# Patient Record
Sex: Female | Born: 1945
Health system: Southern US, Community
[De-identification: ages and names within clinical notes are randomized; demographics above are authoritative.]

## PROBLEM LIST (undated history)

## (undated) DIAGNOSIS — R0683 Snoring: Secondary | ICD-10-CM

## (undated) DIAGNOSIS — E7800 Pure hypercholesterolemia, unspecified: Secondary | ICD-10-CM

## (undated) DIAGNOSIS — K7460 Unspecified cirrhosis of liver: Secondary | ICD-10-CM

## (undated) DIAGNOSIS — E079 Disorder of thyroid, unspecified: Secondary | ICD-10-CM

## (undated) DIAGNOSIS — E119 Type 2 diabetes mellitus without complications: Secondary | ICD-10-CM

## (undated) DIAGNOSIS — Z972 Presence of dental prosthetic device (complete) (partial): Secondary | ICD-10-CM

## (undated) DIAGNOSIS — C801 Malignant (primary) neoplasm, unspecified: Secondary | ICD-10-CM

## (undated) DIAGNOSIS — Z9289 Personal history of other medical treatment: Secondary | ICD-10-CM

## (undated) HISTORY — PX: ABDOMINAL HYSTERECTOMY: SHX81

## (undated) HISTORY — PX: THYROIDECTOMY: SHX17

## (undated) HISTORY — PX: ECTOPIC PREGNANCY SURGERY: SHX613

## (undated) HISTORY — PX: ABDOMINAL EXPLORATION SURGERY: SHX538

---

## 2013-03-23 ENCOUNTER — Ambulatory Visit: Payer: Self-pay | Admitting: Family Medicine

## 2013-03-28 ENCOUNTER — Emergency Department: Payer: Self-pay | Admitting: Internal Medicine

## 2013-03-28 LAB — COMPREHENSIVE METABOLIC PANEL
Alkaline Phosphatase: 101 U/L (ref 50–136)
Anion Gap: 4 — ABNORMAL LOW (ref 7–16)
BUN: 14 mg/dL (ref 7–18)
Bilirubin,Total: 0.3 mg/dL (ref 0.2–1.0)
Chloride: 99 mmol/L (ref 98–107)
Co2: 29 mmol/L (ref 21–32)
Creatinine: 1.03 mg/dL (ref 0.60–1.30)
EGFR (Non-African Amer.): 57 — ABNORMAL LOW
SGPT (ALT): 32 U/L (ref 12–78)

## 2013-03-28 LAB — CBC WITH DIFFERENTIAL/PLATELET
Basophil #: 0.1 10*3/uL (ref 0.0–0.1)
Basophil %: 0.8 %
HCT: 45.7 % (ref 35.0–47.0)
HGB: 15.9 g/dL (ref 12.0–16.0)
Lymphocyte #: 2.3 10*3/uL (ref 1.0–3.6)
Lymphocyte %: 30.7 %
MCV: 95 fL (ref 80–100)
Monocyte %: 6.8 %
Neutrophil %: 58.2 %
Platelet: 311 10*3/uL (ref 150–440)
RBC: 4.83 10*6/uL (ref 3.80–5.20)
RDW: 12.7 % (ref 11.5–14.5)

## 2013-03-29 ENCOUNTER — Emergency Department: Payer: Self-pay | Admitting: Emergency Medicine

## 2013-04-02 LAB — CULTURE, BLOOD (SINGLE)

## 2013-07-09 ENCOUNTER — Ambulatory Visit: Payer: Self-pay | Admitting: Family Medicine

## 2014-11-17 ENCOUNTER — Other Ambulatory Visit: Payer: Self-pay | Admitting: Family Medicine

## 2014-11-17 DIAGNOSIS — Z1231 Encounter for screening mammogram for malignant neoplasm of breast: Secondary | ICD-10-CM

## 2014-11-24 ENCOUNTER — Ambulatory Visit: Payer: Medicare Other | Attending: Family Medicine

## 2015-05-11 ENCOUNTER — Ambulatory Visit
Admission: RE | Admit: 2015-05-11 | Discharge: 2015-05-11 | Disposition: A | Discharge status: Home / Self Care | Payer: Medicare Other | Source: Ambulatory Visit | Attending: Family Medicine | Admitting: Family Medicine

## 2015-05-11 DIAGNOSIS — Z1231 Encounter for screening mammogram for malignant neoplasm of breast: Secondary | ICD-10-CM | POA: Diagnosis not present

## 2016-06-15 ENCOUNTER — Emergency Department
Admission: EM | Admit: 2016-06-15 | Discharge: 2016-06-15 | Disposition: A | Discharge status: Home / Self Care | Payer: Medicare Other | Attending: Emergency Medicine | Admitting: Emergency Medicine

## 2016-06-15 ENCOUNTER — Emergency Department: Payer: Medicare Other

## 2016-06-15 ENCOUNTER — Encounter: Payer: Self-pay | Admitting: Emergency Medicine

## 2016-06-15 DIAGNOSIS — E119 Type 2 diabetes mellitus without complications: Secondary | ICD-10-CM | POA: Diagnosis not present

## 2016-06-15 DIAGNOSIS — R1030 Lower abdominal pain, unspecified: Secondary | ICD-10-CM | POA: Diagnosis present

## 2016-06-15 DIAGNOSIS — K529 Noninfective gastroenteritis and colitis, unspecified: Secondary | ICD-10-CM | POA: Diagnosis not present

## 2016-06-15 DIAGNOSIS — Z87891 Personal history of nicotine dependence: Secondary | ICD-10-CM | POA: Diagnosis not present

## 2016-06-15 DIAGNOSIS — Z79899 Other long term (current) drug therapy: Secondary | ICD-10-CM | POA: Diagnosis not present

## 2016-06-15 HISTORY — DX: Type 2 diabetes mellitus without complications: E11.9

## 2016-06-15 HISTORY — DX: Disorder of thyroid, unspecified: E07.9

## 2016-06-15 LAB — COMPREHENSIVE METABOLIC PANEL
ALK PHOS: 56 U/L (ref 38–126)
ALT: 68 U/L — AB (ref 14–54)
ANION GAP: 9 (ref 5–15)
AST: 51 U/L — ABNORMAL HIGH (ref 15–41)
Albumin: 4.1 g/dL (ref 3.5–5.0)
BUN: 19 mg/dL (ref 6–20)
CALCIUM: 9.5 mg/dL (ref 8.9–10.3)
CHLORIDE: 102 mmol/L (ref 101–111)
CO2: 26 mmol/L (ref 22–32)
CREATININE: 0.69 mg/dL (ref 0.44–1.00)
Glucose, Bld: 328 mg/dL — ABNORMAL HIGH (ref 65–99)
Potassium: 3.9 mmol/L (ref 3.5–5.1)
SODIUM: 137 mmol/L (ref 135–145)
Total Bilirubin: 0.7 mg/dL (ref 0.3–1.2)
Total Protein: 7.7 g/dL (ref 6.5–8.1)

## 2016-06-15 LAB — URINALYSIS, COMPLETE (UACMP) WITH MICROSCOPIC
BILIRUBIN URINE: NEGATIVE
Glucose, UA: >=500 mg/dL — AB
HGB URINE DIPSTICK: NEGATIVE
KETONES UR: 5 mg/dL — AB
Nitrite: NEGATIVE
PH: 5 (ref 5.0–8.0)
PROTEIN: NEGATIVE mg/dL
Specific Gravity, Urine: 1.025 (ref 1.005–1.030)

## 2016-06-15 LAB — CBC
HCT: 38.8 % (ref 35.0–47.0)
HEMOGLOBIN: 13.6 g/dL (ref 12.0–16.0)
MCH: 33.1 pg (ref 26.0–34.0)
MCHC: 35 g/dL (ref 32.0–36.0)
MCV: 94.3 fL (ref 80.0–100.0)
PLATELETS: 253 10*3/uL (ref 150–440)
RBC: 4.12 MIL/uL (ref 3.80–5.20)
RDW: 13 % (ref 11.5–14.5)
WBC: 5.3 10*3/uL (ref 3.6–11.0)

## 2016-06-15 LAB — LIPASE, BLOOD: LIPASE: 60 U/L — AB (ref 11–51)

## 2016-06-15 MED ORDER — DICYCLOMINE HCL 20 MG PO TABS: 20.0000 mg | ORAL_TABLET | Freq: Three times a day (TID) | ORAL | 0 refills | Status: DC | PRN

## 2016-06-15 MED ORDER — IOPAMIDOL (ISOVUE-300) INJECTION 61%
100.0000 mL | Freq: Once | INTRAVENOUS | Status: AC | PRN
  Administered 2016-06-15: 100 mL via INTRAVENOUS
  Filled 2016-06-15: qty 100

## 2016-06-15 MED ORDER — ONDANSETRON HCL 4 MG PO TABS: 4.0000 mg | ORAL_TABLET | Freq: Three times a day (TID) | ORAL | 0 refills | Status: DC | PRN

## 2016-06-15 MED ORDER — IOPAMIDOL (ISOVUE-300) INJECTION 61%
30.0000 mL | Freq: Once | INTRAVENOUS | Status: AC | PRN
  Administered 2016-06-15: 30 mL via ORAL
  Filled 2016-06-15: qty 30

## 2016-06-15 MED ORDER — KETOROLAC TROMETHAMINE 30 MG/ML IJ SOLN
15.0000 mg | Freq: Once | INTRAMUSCULAR | Status: AC
  Administered 2016-06-15: 15 mg via INTRAVENOUS
  Filled 2016-06-15: qty 1

## 2016-06-15 MED ORDER — SODIUM CHLORIDE 0.9 % IV BOLUS (SEPSIS)
500.0000 mL | Freq: Once | INTRAVENOUS | Status: AC
  Administered 2016-06-15: 500 mL via INTRAVENOUS

## 2016-06-15 MED ORDER — ONDANSETRON HCL 4 MG/2ML IJ SOLN
4.0000 mg | Freq: Once | INTRAMUSCULAR | Status: AC
  Administered 2016-06-15: 4 mg via INTRAVENOUS
  Filled 2016-06-15: qty 2

## 2016-06-15 NOTE — ED Provider Notes (Signed)
Time Seen: Approximately 1404 I have reviewed the triage notes  Chief Complaint: Abdominal Pain and Fever   History of Present Illness: Tiffany Mann is a 71 y.o. female who presents with some lower abdominal pain with feelings of a fever that started on Wednesday (2 days prior to arrival). Patient reports some nausea no persistent vomiting. She describes some loose stool without melena or hematochezia. She is not aware of any obvious foodborne exposures. She states she has noticed that her blood sugars have run elevated for the past week. She points primarily to the lower middle quadrant and left side slightly worse than the right. Surgical history includes abdominal hysterectomy with oophorectomy by description along with an appendectomy. She describes the pain is radiating from the lower abdominal region up toward the umbilicus and occasionally toward the epigastric region. She denies any back or flank pain   Past Medical History:  Diagnosis Date  . Diabetes mellitus without complication ([place])   . Thyroid disease     There are no active problems to display for this patient.   Past Surgical History:  Procedure Laterality Date  . ABDOMINAL HYSTERECTOMY    . THYROIDECTOMY      Past Surgical History:  Procedure Laterality Date  . ABDOMINAL HYSTERECTOMY    . THYROIDECTOMY        Allergies:  Patient has no known allergies.  Family History: No family history on file.  Social History: Social History  Substance Use Topics  . Smoking status: Former Smoker    Types: Cigarettes    Quit date: 54  . Smokeless tobacco: Never Used  . Alcohol use No     Review of Systems:   10 point review of systems was performed and was otherwise negative:  Constitutional: No fever Eyes: No visual disturbances ENT: No sore throat, ear pain Cardiac: No chest pain Respiratory: No shortness of breath, wheezing, or stridor Abdomen: Abdominal pains lower middle without any  lateralization. She describes it as constant and occasionally with sharp exacerbations. Endocrine: No weight loss, No night sweats Extremities: No peripheral edema, cyanosis Skin: No rashes, easy bruising Neurologic: No focal weakness, trouble with speech or swollowing Urologic: No dysuria, Hematuria, or urinary frequency   Physical Exam:  ED Triage Vitals [06/15/16 1047]  Enc Vitals Group     BP (!) 173/93     Pulse Rate (!) 104     Resp 20     Temp 98.7 F (37.1 C)     Temp Source Oral     SpO2 97 %     Weight 160 lb (72.6 kg)     Height '5\' 4"'$  (1.626 m)     Head Circumference      Peak Flow      Pain Score 7     Pain Loc      Pain Edu?      Excl. in [place]?     General: Awake , Alert , and Oriented times 3; GCS 15 Head: Normal cephalic , atraumatic Eyes: Pupils equal , round, reactive to light Nose/Throat: No nasal drainage, patent upper airway without erythema or exudate.  Neck: Supple, Full range of motion, No anterior adenopathy or palpable thyroid masses Lungs: Clear to ascultation without wheezes , rhonchi, or rales Heart: Regular rate, regular rhythm without murmurs , gallops , or rubs Abdomen: Mild tenderness to deep palpation without any rebound, guarding, or rigidity. No palpable masses and bowel sounds are positive and symmetric in all 4 quadrants.  Extremities: 2 plus symmetric pulses. No edema, clubbing or cyanosis Neurologic: normal ambulation, Motor symmetric without deficits, sensory intact Skin: warm, dry, no rashes   Labs:   All laboratory work was reviewed including any pertinent negatives or positives listed below:  Labs Reviewed  LIPASE, BLOOD - Abnormal; Notable for the following:       Result Value   Lipase 60 (*)    All other components within normal limits  COMPREHENSIVE METABOLIC PANEL - Abnormal; Notable for the following:    Glucose, Bld 328 (*)    AST 51 (*)    ALT 68 (*)    All other components within normal limits  URINALYSIS,  COMPLETE (UACMP) WITH MICROSCOPIC - Abnormal; Notable for the following:    Color, Urine YELLOW (*)    APPearance CLEAR (*)    Glucose, UA >=500 (*)    Ketones, ur 5 (*)    Leukocytes, UA SMALL (*)    Bacteria, UA RARE (*)    Squamous Epithelial / LPF 6-30 (*)    All other components within normal limits  CBC    Radiology: *   "Ct Abdomen Pelvis W Contrast  Result Date: 06/15/2016 CLINICAL DATA:  Lower abdominal pain and fever EXAM: CT ABDOMEN AND PELVIS WITH CONTRAST TECHNIQUE: Multidetector CT imaging of the abdomen and pelvis was performed using the standard protocol following bolus administration of intravenous contrast. CONTRAST:  153m ISOVUE-300 IOPAMIDOL (ISOVUE-300) INJECTION 61% COMPARISON:  None. FINDINGS: Lower chest: Mild atelectatic changes are noted bilaterally. Hepatobiliary: No focal liver abnormality is seen. No gallstones, gallbladder wall thickening, or biliary dilatation. Pancreas: Unremarkable. No pancreatic ductal dilatation or surrounding inflammatory changes. Spleen: Normal in size without focal abnormality. Adrenals/Urinary Tract: Adrenal glands are unremarkable. Kidneys are normal, without renal calculi, focal lesion, or hydronephrosis. Bladder is unremarkable. Stomach/Bowel: Stomach is within normal limits. Appendix appears normal. No evidence of bowel wall thickening, distention, or inflammatory changes. Vascular/Lymphatic: Aortic atherosclerosis. No enlarged abdominal or pelvic lymph nodes. Reproductive: Status post hysterectomy. No adnexal masses. Other: Fat containing umbilical hernia is noted. Musculoskeletal: Degenerative changes of lumbar spine are noted. IMPRESSION: Chronic changes as described above.  No acute abnormality is noted. Electronically Signed   By: MInez CatalinaM.D.   On: 06/15/2016 16:13  "    I personally reviewed the radiologic studies     ED Course:  Patient's stay was uneventful and her differential was primarily around gastroenteritis,  colitis, diverticulitis. Her lipase and her LFTs are only slightly elevated I felt of not significant clinical concern at this time. Patient most likely has gastroenteritis based on her history and review of systems and CAT scan findings etc. Patient will be prescribed medication directed at such and advised to seek care if she develops any blood in her stool, persistent vomiting, high fever, or change in the location and characteristics of her abdominal pain.     Assessment:  Acute unspecified abdominal pain     Plan:  Outpatient " Discharge Medication List as of 06/15/2016  4:27 PM    START taking these medications   Details  dicyclomine (BENTYL) 20 MG tablet Take 1 tablet (20 mg total) by mouth 3 (three) times daily as needed for spasms., Starting Fri 06/15/2016, Print    ondansetron (ZOFRAN) 4 MG tablet Take 1 tablet (4 mg total) by mouth every 8 (eight) hours as needed for nausea or vomiting., Starting Fri 06/15/2016, Print      " Patient was advised to return immediately if condition  worsens. Patient was advised to follow up with their primary care physician or other specialized physicians involved in their outpatient care. The patient and/or family member/power of attorney had laboratory results reviewed at the bedside. All questions and concerns were addressed and appropriate discharge instructions were distributed by the nursing staff. Patient was especially advised to keep close observation of her blood sugars and adjust her home medication accordingly.           Daymon Larsen, MD 06/15/16 [phone]

## 2016-06-15 NOTE — Discharge Instructions (Signed)
Return to emergency department or be seen as soon as possible for high fever, bloody diarrhea, increasing abdominal pain especially with vomiting or any other new concerns. Please adjust your diabetic medications appropriately for mildly elevated blood sugar  Please return immediately if condition worsens. Please contact her primary physician or the physician you were given for referral. If you have any specialist physicians involved in her treatment and plan please also contact them. Thank you for using [facility] emergency Department.

## 2016-06-15 NOTE — ED Triage Notes (Signed)
Pt in via POV with complaints of lower abdominal pain and fever since Wednesday, pt reports N/V/D.  Pt in no immediate distress at this time.

## 2017-01-04 ENCOUNTER — Other Ambulatory Visit: Payer: Self-pay | Admitting: Family Medicine

## 2017-01-04 DIAGNOSIS — Z1239 Encounter for other screening for malignant neoplasm of breast: Secondary | ICD-10-CM

## 2017-01-04 DIAGNOSIS — Z780 Asymptomatic menopausal state: Secondary | ICD-10-CM

## 2017-02-12 ENCOUNTER — Other Ambulatory Visit: Payer: Medicare Other

## 2017-03-25 ENCOUNTER — Ambulatory Visit
Admission: RE | Admit: 2017-03-25 | Discharge: 2017-03-25 | Disposition: A | Discharge status: Home / Self Care | Payer: Medicare Other | Source: Ambulatory Visit | Attending: Family Medicine | Admitting: Family Medicine

## 2017-03-25 DIAGNOSIS — Z780 Asymptomatic menopausal state: Secondary | ICD-10-CM | POA: Diagnosis present

## 2017-03-25 DIAGNOSIS — Z1231 Encounter for screening mammogram for malignant neoplasm of breast: Secondary | ICD-10-CM | POA: Insufficient documentation

## 2017-03-25 DIAGNOSIS — E119 Type 2 diabetes mellitus without complications: Secondary | ICD-10-CM | POA: Diagnosis not present

## 2017-03-25 DIAGNOSIS — R928 Other abnormal and inconclusive findings on diagnostic imaging of breast: Secondary | ICD-10-CM | POA: Diagnosis not present

## 2017-03-25 DIAGNOSIS — Z1239 Encounter for other screening for malignant neoplasm of breast: Secondary | ICD-10-CM | POA: Insufficient documentation

## 2017-03-29 ENCOUNTER — Other Ambulatory Visit: Payer: Self-pay | Admitting: Family Medicine

## 2017-03-29 DIAGNOSIS — N6489 Other specified disorders of breast: Secondary | ICD-10-CM

## 2017-03-29 DIAGNOSIS — R928 Other abnormal and inconclusive findings on diagnostic imaging of breast: Secondary | ICD-10-CM

## 2017-04-04 ENCOUNTER — Ambulatory Visit
Admission: RE | Admit: 2017-04-04 | Discharge: 2017-04-04 | Disposition: A | Discharge status: Home / Self Care | Payer: Medicare Other | Source: Ambulatory Visit | Attending: Family Medicine | Admitting: Family Medicine

## 2017-04-04 DIAGNOSIS — N6489 Other specified disorders of breast: Secondary | ICD-10-CM | POA: Diagnosis not present

## 2017-04-04 DIAGNOSIS — R928 Other abnormal and inconclusive findings on diagnostic imaging of breast: Secondary | ICD-10-CM

## 2017-08-21 NOTE — Discharge Instructions (Signed)

## 2017-08-27 ENCOUNTER — Encounter
Admission: RE | Disposition: A | Discharge status: Home / Self Care | Payer: Self-pay | Source: Ambulatory Visit | Attending: Ophthalmology

## 2017-08-27 ENCOUNTER — Ambulatory Visit: Payer: Medicare Other | Admitting: Anesthesiology

## 2017-08-27 ENCOUNTER — Ambulatory Visit
Admission: RE | Admit: 2017-08-27 | Discharge: 2017-08-27 | Disposition: A | Discharge status: Home / Self Care | Payer: Medicare Other | Source: Ambulatory Visit | Attending: Ophthalmology | Admitting: Ophthalmology

## 2017-08-27 DIAGNOSIS — H2512 Age-related nuclear cataract, left eye: Secondary | ICD-10-CM | POA: Diagnosis not present

## 2017-08-27 DIAGNOSIS — I10 Essential (primary) hypertension: Secondary | ICD-10-CM | POA: Diagnosis not present

## 2017-08-27 DIAGNOSIS — E119 Type 2 diabetes mellitus without complications: Secondary | ICD-10-CM | POA: Diagnosis not present

## 2017-08-27 DIAGNOSIS — Z7984 Long term (current) use of oral hypoglycemic drugs: Secondary | ICD-10-CM | POA: Diagnosis not present

## 2017-08-27 DIAGNOSIS — Z7982 Long term (current) use of aspirin: Secondary | ICD-10-CM | POA: Insufficient documentation

## 2017-08-27 DIAGNOSIS — Z87891 Personal history of nicotine dependence: Secondary | ICD-10-CM | POA: Diagnosis not present

## 2017-08-27 DIAGNOSIS — Z79899 Other long term (current) drug therapy: Secondary | ICD-10-CM | POA: Insufficient documentation

## 2017-08-27 HISTORY — PX: CATARACT EXTRACTION W/PHACO: SHX586

## 2017-08-27 HISTORY — DX: Snoring: R06.83

## 2017-08-27 HISTORY — DX: Presence of dental prosthetic device (complete) (partial): Z97.2

## 2017-08-27 HISTORY — DX: Pure hypercholesterolemia, unspecified: E78.00

## 2017-08-27 HISTORY — DX: Personal history of other medical treatment: Z92.89

## 2017-08-27 LAB — GLUCOSE, CAPILLARY
GLUCOSE-CAPILLARY: 199 mg/dL — AB (ref 65–99)
GLUCOSE-CAPILLARY: 191 mg/dL — AB (ref 65–99)

## 2017-08-27 SURGERY — PHACOEMULSIFICATION, CATARACT, WITH IOL INSERTION
Anesthesia: Monitor Anesthesia Care | Laterality: Left | Wound class: "Clean "

## 2017-08-27 MED ORDER — FENTANYL CITRATE (PF) 100 MCG/2ML IJ SOLN: 25.0000 ug | INTRAMUSCULAR | Status: DC | PRN

## 2017-08-27 MED ORDER — PROMETHAZINE HCL 25 MG/ML IJ SOLN: 6.2500 mg | INTRAMUSCULAR | Status: DC | PRN

## 2017-08-27 MED ORDER — MOXIFLOXACIN HCL 0.5 % OP SOLN
OPHTHALMIC | Status: DC | PRN
  Administered 2017-08-27: 1 [drp] via OPHTHALMIC

## 2017-08-27 MED ORDER — OXYCODONE HCL 5 MG PO TABS: 5.0000 mg | ORAL_TABLET | Freq: Once | ORAL | Status: DC | PRN

## 2017-08-27 MED ORDER — LIDOCAINE HCL (PF) 2 % IJ SOLN
INTRAOCULAR | Status: DC | PRN
  Administered 2017-08-27: 1 mL via INTRAOCULAR

## 2017-08-27 MED ORDER — OXYCODONE HCL 5 MG/5ML PO SOLN: 5.0000 mg | Freq: Once | ORAL | Status: DC | PRN

## 2017-08-27 MED ORDER — SODIUM HYALURONATE 23 MG/ML IO SOLN
INTRAOCULAR | Status: DC | PRN
  Administered 2017-08-27: 0.6 mL via INTRAOCULAR

## 2017-08-27 MED ORDER — SODIUM HYALURONATE 10 MG/ML IO SOLN
INTRAOCULAR | Status: DC | PRN
  Administered 2017-08-27: 0.55 mL via INTRAOCULAR

## 2017-08-27 MED ORDER — MEPERIDINE HCL 25 MG/ML IJ SOLN: 6.2500 mg | INTRAMUSCULAR | Status: DC | PRN

## 2017-08-27 MED ORDER — EPINEPHRINE PF 1 MG/ML IJ SOLN
INTRAOCULAR | Status: DC | PRN
  Administered 2017-08-27: 80 mL via OPHTHALMIC

## 2017-08-27 MED ORDER — ARMC OPHTHALMIC DILATING DROPS
1.0000 "application " | OPHTHALMIC | Status: DC | PRN
  Administered 2017-08-27 (×3): 1 via OPHTHALMIC

## 2017-08-27 MED ORDER — LACTATED RINGERS IV SOLN: 10.0000 mL/h | INTRAVENOUS | Status: DC

## 2017-08-27 MED ORDER — MIDAZOLAM HCL 2 MG/2ML IJ SOLN
INTRAMUSCULAR | Status: DC | PRN
  Administered 2017-08-27: 2 mg via INTRAVENOUS

## 2017-08-27 MED ORDER — FENTANYL CITRATE (PF) 100 MCG/2ML IJ SOLN
INTRAMUSCULAR | Status: DC | PRN
  Administered 2017-08-27: 50 ug via INTRAVENOUS

## 2017-08-27 MED ORDER — LACTATED RINGERS IV SOLN: INTRAVENOUS | Status: DC

## 2017-08-27 SURGICAL SUPPLY — 17 items
CANNULA ANT/CHMB 27G (MISCELLANEOUS) ×1 IMPLANT
CANNULA ANT/CHMB 27GA (MISCELLANEOUS) ×3 IMPLANT
DISSECTOR HYDRO NUCLEUS 50X22 (MISCELLANEOUS) ×3 IMPLANT
GLOVE BIO SURGEON STRL SZ8 (GLOVE) ×3 IMPLANT
GLOVE SURG LX 7.5 STRW (GLOVE) ×2
GLOVE SURG LX STRL 7.5 STRW (GLOVE) ×1 IMPLANT
GOWN STRL REUS W/ TWL LRG LVL3 (GOWN DISPOSABLE) ×2 IMPLANT
GOWN STRL REUS W/TWL LRG LVL3 (GOWN DISPOSABLE) ×4
LENS IOL TECNIS ITEC 21.0 (Intraocular Lens) ×2 IMPLANT
MARKER SKIN DUAL TIP RULER LAB (MISCELLANEOUS) ×3 IMPLANT
PACK CATARACT (MISCELLANEOUS) ×3 IMPLANT
PACK DR. KING ARMS (PACKS) ×3 IMPLANT
PACK EYE AFTER SURG (MISCELLANEOUS) ×3 IMPLANT
SYR 3ML LL SCALE MARK (SYRINGE) ×3 IMPLANT
SYR TB 1ML LUER SLIP (SYRINGE) ×3 IMPLANT
WATER STERILE IRR 500ML POUR (IV SOLUTION) ×3 IMPLANT
WIPE NON LINTING 3.25X3.25 (MISCELLANEOUS) ×3 IMPLANT

## 2017-08-27 NOTE — Op Note (Signed)
OPERATIVE NOTE  SHAKURA COWING [id number] 08/27/2017   PREOPERATIVE DIAGNOSIS:  Nuclear sclerotic cataract left eye.  H25.12   POSTOPERATIVE DIAGNOSIS:    Nuclear sclerotic cataract left eye.     PROCEDURE:  Phacoemusification with posterior chamber intraocular lens placement of the left eye   LENS:   Implant Name Type Inv. Item Serial No. Manufacturer Lot No. LRB No. Used  LENS IOL DIOP 21.0 - [id number] Intraocular Lens LENS IOL DIOP 21.0 [id number] AMO  Left 1       PCB00 +21.0   ULTRASOUND TIME: 0 minutes 31 seconds.  CDE 6.37   SURGEON:  Benay Pillow, MD, MPH   ANESTHESIA:  Topical with tetracaine drops augmented with 1% preservative-free intracameral lidocaine.  ESTIMATED BLOOD LOSS: <1 mL   COMPLICATIONS:  None.   DESCRIPTION OF PROCEDURE:  The patient was identified in the holding room and transported to the operating room and placed in the supine position under the operating microscope.  The left eye was identified as the operative eye and it was prepped and draped in the usual sterile ophthalmic fashion.   A 1.0 millimeter clear-corneal paracentesis was made at the 5:00 position. 0.5 ml of preservative-free 1% lidocaine with epinephrine was injected into the anterior chamber.  The anterior chamber was filled with Healon 5 viscoelastic.  A 2.4 millimeter keratome was used to make a near-clear corneal incision at the 2:00 position.  A curvilinear capsulorrhexis was made with a cystotome and capsulorrhexis forceps.  Balanced salt solution was used to hydrodissect and hydrodelineate the nucleus.   Phacoemulsification was then used in stop and chop fashion to remove the lens nucleus and epinucleus.  The remaining cortex was then removed using the irrigation and aspiration handpiece. Healon was then placed into the capsular bag to distend it for lens placement.  A lens was then injected into the capsular bag.  The remaining viscoelastic was aspirated.   Wounds were  hydrated with balanced salt solution.  The anterior chamber was inflated to a physiologic pressure with balanced salt solution.  Intracameral vigamox 0.1 mL undiltued was injected into the eye and a drop placed onto the ocular surface.  No wound leaks were noted.  The patient was taken to the recovery room in stable condition without complications of anesthesia or surgery  Benay Pillow 08/27/2017, 7:50 AM

## 2017-08-27 NOTE — Transfer of Care (Signed)
Immediate Anesthesia Transfer of Care Note  Patient: Tiffany Mann  Procedure(s) Performed: CATARACT EXTRACTION PHACO AND INTRAOCULAR LENS PLACEMENT (IOC) LEFT DIABETIC (Left )  Patient Location: PACU  Anesthesia Type: MAC  Level of Consciousness: awake, alert  and patient cooperative  Airway and Oxygen Therapy: Patient Spontanous Breathing and Patient connected to supplemental oxygen  Post-op Assessment: Post-op Vital signs reviewed, Patient's Cardiovascular Status Stable, Respiratory Function Stable, Patent Airway and No signs of Nausea or vomiting  Post-op Vital Signs: Reviewed and stable  Complications: No apparent anesthesia complications

## 2017-08-27 NOTE — Anesthesia Preprocedure Evaluation (Signed)
Anesthesia Evaluation  Patient identified by MRN, date of birth, ID band Patient awake    Reviewed: Allergy & Precautions, H&P , NPO status , Patient's Chart, lab work & pertinent test results, reviewed documented beta blocker date and time   History of Anesthesia Complications Negative for: history of anesthetic complications  Airway Mallampati: II  TM Distance: >3 FB Neck ROM: full    Dental no notable dental hx.    Pulmonary former smoker,    Pulmonary exam normal breath sounds clear to auscultation       Cardiovascular Exercise Tolerance: Good negative cardio ROS   Rhythm:regular Rate:Normal     Neuro/Psych negative neurological ROS  negative psych ROS   GI/Hepatic negative GI ROS, Neg liver ROS,   Endo/Other  diabetes  Renal/GU negative Renal ROS  negative genitourinary   Musculoskeletal   Abdominal   Peds  Hematology negative hematology ROS (+)   Anesthesia Other Findings   Reproductive/Obstetrics negative OB ROS                             Anesthesia Physical Anesthesia Plan  ASA: II  Anesthesia Plan: MAC   Post-op Pain Management:    Induction:   PONV Risk Score and Plan:   Airway Management Planned:   Additional Equipment:   Intra-op Plan:   Post-operative Plan:   Informed Consent: I have reviewed the patients History and Physical, chart, labs and discussed the procedure including the risks, benefits and alternatives for the proposed anesthesia with the patient or authorized representative who has indicated his/her understanding and acceptance.   Dental Advisory Given  Plan Discussed with: CRNA  Anesthesia Plan Comments:         Anesthesia Quick Evaluation

## 2017-08-27 NOTE — H&P (Signed)
The History and Physical notes are on paper, have been signed, and are to be scanned.   I have examined the patient and there are no changes to the H&P.   Benay Pillow 08/27/2017 7:20 AM

## 2017-08-27 NOTE — Anesthesia Postprocedure Evaluation (Signed)
Anesthesia Post Note  Patient: Tiffany Mann  Procedure(s) Performed: CATARACT EXTRACTION PHACO AND INTRAOCULAR LENS PLACEMENT (IOC) LEFT DIABETIC (Left )  Patient location during evaluation: PACU Anesthesia Type: MAC Level of consciousness: awake and alert Pain management: pain level controlled Vital Signs Assessment: post-procedure vital signs reviewed and stable Respiratory status: spontaneous breathing, nonlabored ventilation, respiratory function stable and patient connected to nasal cannula oxygen Cardiovascular status: stable and blood pressure returned to baseline Postop Assessment: no apparent nausea or vomiting Anesthetic complications: no    SCOURAS, NICOLE ELAINE

## 2017-08-27 NOTE — Anesthesia Procedure Notes (Signed)
Procedure Name: MAC Date/Time: 08/27/2017 7:27 AM Performed by: Janna Arch, CRNA Pre-anesthesia Checklist: Patient identified, Emergency Drugs available, Suction available and Patient being monitored Patient Re-evaluated:Patient Re-evaluated prior to induction Oxygen Delivery Method: Nasal cannula

## 2017-08-28 ENCOUNTER — Encounter: Payer: Self-pay | Admitting: Ophthalmology

## 2017-12-29 ENCOUNTER — Other Ambulatory Visit: Payer: Self-pay

## 2017-12-29 ENCOUNTER — Emergency Department: Payer: Medicare Other

## 2017-12-29 ENCOUNTER — Encounter: Payer: Self-pay | Admitting: Radiology

## 2017-12-29 ENCOUNTER — Emergency Department
Admission: EM | Admit: 2017-12-29 | Discharge: 2017-12-29 | Disposition: A | Discharge status: Home / Self Care | Payer: Medicare Other | Attending: Emergency Medicine | Admitting: Emergency Medicine

## 2017-12-29 DIAGNOSIS — E872 Acidosis, unspecified: Secondary | ICD-10-CM

## 2017-12-29 DIAGNOSIS — E119 Type 2 diabetes mellitus without complications: Secondary | ICD-10-CM | POA: Insufficient documentation

## 2017-12-29 DIAGNOSIS — R197 Diarrhea, unspecified: Secondary | ICD-10-CM

## 2017-12-29 DIAGNOSIS — R112 Nausea with vomiting, unspecified: Secondary | ICD-10-CM | POA: Diagnosis not present

## 2017-12-29 DIAGNOSIS — R1084 Generalized abdominal pain: Secondary | ICD-10-CM

## 2017-12-29 DIAGNOSIS — Z7984 Long term (current) use of oral hypoglycemic drugs: Secondary | ICD-10-CM | POA: Diagnosis not present

## 2017-12-29 DIAGNOSIS — Z87891 Personal history of nicotine dependence: Secondary | ICD-10-CM | POA: Diagnosis not present

## 2017-12-29 DIAGNOSIS — Z7982 Long term (current) use of aspirin: Secondary | ICD-10-CM | POA: Diagnosis not present

## 2017-12-29 LAB — TROPONIN I

## 2017-12-29 LAB — CBC WITH DIFFERENTIAL/PLATELET
BASOS ABS: 0 10*3/uL (ref 0–0.1)
BASOS PCT: 0 %
EOS ABS: 0.2 10*3/uL (ref 0–0.7)
EOS PCT: 2 %
HCT: 43.9 % (ref 35.0–47.0)
Hemoglobin: 15 g/dL (ref 12.0–16.0)
LYMPHS ABS: 1 10*3/uL (ref 1.0–3.6)
Lymphocytes Relative: 8 %
MCH: 32.7 pg (ref 26.0–34.0)
MCHC: 34.2 g/dL (ref 32.0–36.0)
MCV: 95.5 fL (ref 80.0–100.0)
Monocytes Absolute: 0.6 10*3/uL (ref 0.2–0.9)
Monocytes Relative: 5 %
Neutro Abs: 9.9 10*3/uL — ABNORMAL HIGH (ref 1.4–6.5)
Neutrophils Relative %: 85 %
PLATELETS: 274 10*3/uL (ref 150–440)
RBC: 4.6 MIL/uL (ref 3.80–5.20)
RDW: 12.8 % (ref 11.5–14.5)
WBC: 11.7 10*3/uL — AB (ref 3.6–11.0)

## 2017-12-29 LAB — COMPREHENSIVE METABOLIC PANEL
ALT: 29 U/L (ref 0–44)
AST: 29 U/L (ref 15–41)
Albumin: 4.5 g/dL (ref 3.5–5.0)
Alkaline Phosphatase: 53 U/L (ref 38–126)
Anion gap: 11 (ref 5–15)
BUN: 27 mg/dL — ABNORMAL HIGH (ref 8–23)
CHLORIDE: 102 mmol/L (ref 98–111)
CO2: 25 mmol/L (ref 22–32)
CREATININE: 0.96 mg/dL (ref 0.44–1.00)
Calcium: 9.2 mg/dL (ref 8.9–10.3)
GFR, EST NON AFRICAN AMERICAN: 59 mL/min — AB (ref >60)
Glucose, Bld: 199 mg/dL — ABNORMAL HIGH (ref 70–99)
POTASSIUM: 3.9 mmol/L (ref 3.5–5.1)
Sodium: 138 mmol/L (ref 135–145)
Total Bilirubin: 0.7 mg/dL (ref 0.3–1.2)
Total Protein: 7.9 g/dL (ref 6.5–8.1)

## 2017-12-29 LAB — URINALYSIS, COMPLETE (UACMP) WITH MICROSCOPIC
Bacteria, UA: NONE SEEN
Bilirubin Urine: NEGATIVE
Glucose, UA: >=500 mg/dL — AB
Hgb urine dipstick: NEGATIVE
Ketones, ur: NEGATIVE mg/dL
Leukocytes, UA: NEGATIVE
Nitrite: NEGATIVE
PH: 5 (ref 5.0–8.0)
Protein, ur: NEGATIVE mg/dL

## 2017-12-29 LAB — LACTIC ACID, PLASMA: LACTIC ACID, VENOUS: 2.1 mmol/L — AB (ref 0.5–1.9)

## 2017-12-29 MED ORDER — MORPHINE SULFATE (PF) 4 MG/ML IV SOLN
4.0000 mg | Freq: Once | INTRAVENOUS | Status: AC
  Administered 2017-12-29: 4 mg via INTRAVENOUS
  Filled 2017-12-29: qty 1

## 2017-12-29 MED ORDER — LOPERAMIDE HCL 2 MG PO CAPS
4.0000 mg | ORAL_CAPSULE | Freq: Once | ORAL | Status: AC
  Administered 2017-12-29: 4 mg via ORAL
  Filled 2017-12-29: qty 2

## 2017-12-29 MED ORDER — IOHEXOL 300 MG/ML  SOLN
100.0000 mL | Freq: Once | INTRAMUSCULAR | Status: AC | PRN
  Administered 2017-12-29: 100 mL via INTRAVENOUS

## 2017-12-29 MED ORDER — ONDANSETRON 4 MG PO TBDP: 4.0000 mg | ORAL_TABLET | Freq: Three times a day (TID) | ORAL | 0 refills | Status: DC | PRN

## 2017-12-29 MED ORDER — CIPROFLOXACIN HCL 500 MG PO TABS
750.0000 mg | ORAL_TABLET | Freq: Once | ORAL | Status: AC
  Administered 2017-12-29: 750 mg via ORAL
  Filled 2017-12-29: qty 2

## 2017-12-29 MED ORDER — ONDANSETRON HCL 4 MG/2ML IJ SOLN
4.0000 mg | Freq: Once | INTRAMUSCULAR | Status: AC
  Administered 2017-12-29: 4 mg via INTRAVENOUS
  Filled 2017-12-29: qty 2

## 2017-12-29 MED ORDER — SODIUM CHLORIDE 0.9 % IV BOLUS
1000.0000 mL | Freq: Once | INTRAVENOUS | Status: AC
  Administered 2017-12-29: 1000 mL via INTRAVENOUS

## 2017-12-29 NOTE — ED Notes (Signed)
Pt returned from CT at this time.  

## 2017-12-29 NOTE — ED Notes (Signed)
.   Pt is resting, Respirations even and unlabored, NAD. Stretcher lowest postion and locked. Call bell within reach. Denies any needs at this time RN will continue to monitor.  Pt stating she is feelings better at this time.
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
## 2017-12-29 NOTE — Discharge Instructions (Signed)
Fortunately today your CT scan was reassuring.  The single dose of antibiotics here in the emergency department should be enough to completely cover your infection.  Please purchase over-the-counter loperamide and take a dose after every loose watery stool until your symptoms resolved.  Make sure you remain well-hydrated and follow-up with your primary care physician in 2 days for recheck.  It was a pleasure to take care of you today, and thank you for coming to our emergency department.  If you have any questions or concerns before leaving please ask the nurse to grab me and I'm more than happy to go through your aftercare instructions again.  If you were prescribed any opioid pain medication today such as Norco, Vicodin, Percocet, morphine, hydrocodone, or oxycodone please make sure you do not drive when you are taking this medication as it can alter your ability to drive safely.  If you have any concerns once you are home that you are not improving or are in fact getting worse before you can make it to your follow-up appointment, please do not hesitate to call 911 and come back for further evaluation.  Darel Hong, MD  Results for orders placed or performed during the hospital encounter of 12/29/17  Lactic acid, plasma  Result Value Ref Range   Lactic Acid, Venous 2.1 (HH) 0.5 - 1.9 mmol/L  Comprehensive metabolic panel  Result Value Ref Range   Sodium 138 135 - 145 mmol/L   Potassium 3.9 3.5 - 5.1 mmol/L   Chloride 102 98 - 111 mmol/L   CO2 25 22 - 32 mmol/L   Glucose, Bld 199 (H) 70 - 99 mg/dL   BUN 27 (H) 8 - 23 mg/dL   Creatinine, Ser 0.96 0.44 - 1.00 mg/dL   Calcium 9.2 8.9 - 10.3 mg/dL   Total Protein 7.9 6.5 - 8.1 g/dL   Albumin 4.5 3.5 - 5.0 g/dL   AST 29 15 - 41 U/L   ALT 29 0 - 44 U/L   Alkaline Phosphatase 53 38 - 126 U/L   Total Bilirubin 0.7 0.3 - 1.2 mg/dL   GFR calc non Af Amer 59 (L) >60 mL/min   GFR calc Af Amer >60 >60 mL/min   Anion gap 11 5 - 15  CBC with  Differential  Result Value Ref Range   WBC 11.7 (H) 3.6 - 11.0 K/uL   RBC 4.60 3.80 - 5.20 MIL/uL   Hemoglobin 15.0 12.0 - 16.0 g/dL   HCT 43.9 35.0 - 47.0 %   MCV 95.5 80.0 - 100.0 fL   MCH 32.7 26.0 - 34.0 pg   MCHC 34.2 32.0 - 36.0 g/dL   RDW 12.8 11.5 - 14.5 %   Platelets 274 150 - 440 K/uL   Neutrophils Relative % 85 %   Neutro Abs 9.9 (H) 1.4 - 6.5 K/uL   Lymphocytes Relative 8 %   Lymphs Abs 1.0 1.0 - 3.6 K/uL   Monocytes Relative 5 %   Monocytes Absolute 0.6 0.2 - 0.9 K/uL   Eosinophils Relative 2 %   Eosinophils Absolute 0.2 0 - 0.7 K/uL   Basophils Relative 0 %   Basophils Absolute 0.0 0 - 0.1 K/uL  Troponin I  Result Value Ref Range   Troponin I <0.03 <0.03 ng/mL   Ct Abdomen Pelvis W Contrast  Result Date: 12/29/2017 CLINICAL DATA:  Dizziness, abdominopelvic pain, nausea. Prior hysterectomy. EXAM: CT ABDOMEN AND PELVIS WITH CONTRAST TECHNIQUE: Multidetector CT imaging of the abdomen and pelvis was  performed using the standard protocol following bolus administration of intravenous contrast. CONTRAST:  191mL OMNIPAQUE IOHEXOL 300 MG/ML  SOLN COMPARISON:  06/15/2016 FINDINGS: Lower chest: Mild scarring/atelectasis in the lingula and inferior right middle lobe. Coronary atherosclerosis of the LAD and right coronary artery. Hepatobiliary: Liver is within normal limits. Gallbladder is unremarkable. No intrahepatic or extrahepatic ductal dilatation. Pancreas: Within normal limits. Spleen: Within normal limits. Adrenals/Urinary Tract: Adrenal glands are within normal limits. Kidneys are within normal limits.  No hydronephrosis. Bladder is within normal limits. Stomach/Bowel: Stomach is within normal limits. No evidence of bowel obstruction. Normal appendix (series 2/image 61). No colonic wall thickening or inflammatory changes. Vascular/Lymphatic: No evidence of abdominal aortic aneurysm. Atherosclerotic calcifications of the abdominal aorta and branch vessels. No suspicious  abdominopelvic lymphadenopathy. Reproductive: Status post hysterectomy. No adnexal masses. Other: No abdominopelvic ascites. Small fat containing right paramidline ventral hernia (series 2/image 45). Musculoskeletal: Mild degenerative changes of the visualized thoracolumbar spine. IMPRESSION: No evidence of bowel obstruction.  Normal appendix. No CT findings to account for the patient's abdominal pain. Prior cholecystectomy. Electronically Signed   By: Julian Hy M.D.   On: 12/29/2017 07:28

## 2017-12-29 NOTE — ED Triage Notes (Signed)
Patient reports symptoms began yesterday - dizziness, abdominal pain, vomiting and diarrhea.

## 2018-01-22 ENCOUNTER — Encounter: Payer: Self-pay | Admitting: *Deleted

## 2018-01-22 ENCOUNTER — Other Ambulatory Visit: Payer: Self-pay

## 2018-01-23 NOTE — Discharge Instructions (Signed)

## 2018-01-27 ENCOUNTER — Ambulatory Visit: Payer: Medicare Other | Admitting: Anesthesiology

## 2018-01-27 ENCOUNTER — Ambulatory Visit
Admission: RE | Admit: 2018-01-27 | Discharge: 2018-01-27 | Disposition: A | Discharge status: Home / Self Care | Payer: Medicare Other | Source: Ambulatory Visit | Attending: Ophthalmology | Admitting: Ophthalmology

## 2018-01-27 ENCOUNTER — Encounter
Admission: RE | Disposition: A | Discharge status: Home / Self Care | Payer: Self-pay | Source: Ambulatory Visit | Attending: Ophthalmology

## 2018-01-27 DIAGNOSIS — E7800 Pure hypercholesterolemia, unspecified: Secondary | ICD-10-CM | POA: Insufficient documentation

## 2018-01-27 DIAGNOSIS — E1136 Type 2 diabetes mellitus with diabetic cataract: Secondary | ICD-10-CM | POA: Insufficient documentation

## 2018-01-27 DIAGNOSIS — Z87891 Personal history of nicotine dependence: Secondary | ICD-10-CM | POA: Diagnosis not present

## 2018-01-27 DIAGNOSIS — I10 Essential (primary) hypertension: Secondary | ICD-10-CM | POA: Insufficient documentation

## 2018-01-27 DIAGNOSIS — H2511 Age-related nuclear cataract, right eye: Secondary | ICD-10-CM | POA: Diagnosis not present

## 2018-01-27 HISTORY — PX: CATARACT EXTRACTION W/PHACO: SHX586

## 2018-01-27 LAB — GLUCOSE, CAPILLARY
Glucose-Capillary: 147 mg/dL — ABNORMAL HIGH (ref 70–99)
Glucose-Capillary: 151 mg/dL — ABNORMAL HIGH (ref 70–99)

## 2018-01-27 SURGERY — PHACOEMULSIFICATION, CATARACT, WITH IOL INSERTION
Anesthesia: Monitor Anesthesia Care | Site: Eye | Laterality: Right | Wound class: "Clean "

## 2018-01-27 MED ORDER — LACTATED RINGERS IV SOLN: INTRAVENOUS | Status: DC

## 2018-01-27 MED ORDER — EPINEPHRINE PF 1 MG/ML IJ SOLN
INTRAOCULAR | Status: DC | PRN
  Administered 2018-01-27: 85 mL via OPHTHALMIC

## 2018-01-27 MED ORDER — MOXIFLOXACIN HCL 0.5 % OP SOLN
OPHTHALMIC | Status: DC | PRN
  Administered 2018-01-27: 0.2 mL via OPHTHALMIC

## 2018-01-27 MED ORDER — MIDAZOLAM HCL 2 MG/2ML IJ SOLN
INTRAMUSCULAR | Status: DC | PRN
  Administered 2018-01-27: 2 mg via INTRAVENOUS

## 2018-01-27 MED ORDER — SODIUM HYALURONATE 10 MG/ML IO SOLN
INTRAOCULAR | Status: DC | PRN
  Administered 2018-01-27: 0.55 mL via INTRAOCULAR

## 2018-01-27 MED ORDER — ARMC OPHTHALMIC DILATING DROPS
1.0000 "application " | OPHTHALMIC | Status: DC | PRN
  Administered 2018-01-27 (×3): 1 via OPHTHALMIC

## 2018-01-27 MED ORDER — FENTANYL CITRATE (PF) 100 MCG/2ML IJ SOLN
INTRAMUSCULAR | Status: DC | PRN
  Administered 2018-01-27: 50 ug via INTRAVENOUS

## 2018-01-27 MED ORDER — SODIUM HYALURONATE 23 MG/ML IO SOLN
INTRAOCULAR | Status: DC | PRN
  Administered 2018-01-27: 0.6 mL via INTRAOCULAR

## 2018-01-27 MED ORDER — LIDOCAINE HCL (PF) 2 % IJ SOLN
INTRAOCULAR | Status: DC | PRN
  Administered 2018-01-27: 1 mL via INTRAOCULAR

## 2018-01-27 SURGICAL SUPPLY — 17 items
CANNULA ANT/CHMB 27G (MISCELLANEOUS) ×1 IMPLANT
CANNULA ANT/CHMB 27GA (MISCELLANEOUS) ×3 IMPLANT
DISSECTOR HYDRO NUCLEUS 50X22 (MISCELLANEOUS) ×3 IMPLANT
GLOVE BIO SURGEON STRL SZ8 (GLOVE) ×3 IMPLANT
GLOVE SURG LX 7.5 STRW (GLOVE) ×6
GLOVE SURG LX STRL 7.5 STRW (GLOVE) ×1 IMPLANT
GOWN STRL REUS W/ TWL LRG LVL3 (GOWN DISPOSABLE) ×2 IMPLANT
GOWN STRL REUS W/TWL LRG LVL3 (GOWN DISPOSABLE) ×4
LENS IOL TECNIS ITEC 20.5 (Intraocular Lens) ×2 IMPLANT
MARKER SKIN DUAL TIP RULER LAB (MISCELLANEOUS) ×3 IMPLANT
PACK CATARACT (MISCELLANEOUS) ×3 IMPLANT
PACK DR. KING ARMS (PACKS) ×3 IMPLANT
PACK EYE AFTER SURG (MISCELLANEOUS) ×3 IMPLANT
SYR 3ML LL SCALE MARK (SYRINGE) ×3 IMPLANT
SYR TB 1ML LUER SLIP (SYRINGE) ×3 IMPLANT
WATER STERILE IRR 500ML POUR (IV SOLUTION) ×3 IMPLANT
WIPE NON LINTING 3.25X3.25 (MISCELLANEOUS) ×3 IMPLANT

## 2018-01-27 NOTE — H&P (Signed)
The History and Physical notes are on paper, have been signed, and are to be scanned.   I have examined the patient and there are no changes to the H&P.   Tiffany Mann 01/27/2018 7:19 AM

## 2018-01-27 NOTE — Transfer of Care (Signed)
Immediate Anesthesia Transfer of Care Note  Patient: Tiffany Mann  Procedure(s) Performed: CATARACT EXTRACTION PHACO AND INTRAOCULAR LENS PLACEMENT (IOC) RIGHT (Right Eye)  Patient Location: PACU  Anesthesia Type: MAC  Level of Consciousness: awake, alert  and patient cooperative  Airway and Oxygen Therapy: Patient Spontanous Breathing and Patient connected to supplemental oxygen  Post-op Assessment: Post-op Vital signs reviewed, Patient's Cardiovascular Status Stable, Respiratory Function Stable, Patent Airway and No signs of Nausea or vomiting  Post-op Vital Signs: Reviewed and stable  Complications: No apparent anesthesia complications

## 2018-01-27 NOTE — Anesthesia Postprocedure Evaluation (Signed)
Anesthesia Post Note  Patient: Tiffany Mann  Procedure(s) Performed: CATARACT EXTRACTION PHACO AND INTRAOCULAR LENS PLACEMENT (IOC) RIGHT (Right Eye)  Patient location during evaluation: PACU Anesthesia Type: MAC Level of consciousness: awake Pain management: pain level controlled Vital Signs Assessment: post-procedure vital signs reviewed and stable Respiratory status: spontaneous breathing, nonlabored ventilation and respiratory function stable Cardiovascular status: blood pressure returned to baseline and stable Postop Assessment: no headache Anesthetic complications: no    Lavonna Monarch

## 2018-01-27 NOTE — Op Note (Signed)
OPERATIVE NOTE  Tiffany Mann [id number] 01/27/2018   PREOPERATIVE DIAGNOSIS:  Nuclear sclerotic cataract right eye.  H25.11   POSTOPERATIVE DIAGNOSIS:    Nuclear sclerotic cataract right eye.     PROCEDURE:  Phacoemusification with posterior chamber intraocular lens placement of the right eye   LENS:   Implant Name Type Inv. Item Serial No. Manufacturer Lot No. LRB No. Used  LENS IOL DIOP 20.5 - [id number] Intraocular Lens LENS IOL DIOP 20.5 [id number] AMO  Right 1       PCB00 +20.5   ULTRASOUND TIME: 0 minutes 35 seconds.  CDE 4.13   SURGEON:  Benay Pillow, MD, MPH  ANESTHESIOLOGIST: Anesthesiologist: Lavonna Monarch, MD CRNA: Mayme Genta, CRNA   ANESTHESIA:  Topical with tetracaine drops augmented with 1% preservative-free intracameral lidocaine.  ESTIMATED BLOOD LOSS: less than 1 mL.   COMPLICATIONS:  None.   DESCRIPTION OF PROCEDURE:  The patient was identified in the holding room and transported to the operating room and placed in the supine position under the operating microscope.  The right eye was identified as the operative eye and it was prepped and draped in the usual sterile ophthalmic fashion.   A 1.0 millimeter clear-corneal paracentesis was made at the 10:30 position. 0.5 ml of preservative-free 1% lidocaine with epinephrine was injected into the anterior chamber.  The anterior chamber was filled with Healon 5 viscoelastic.  A 2.4 millimeter keratome was used to make a near-clear corneal incision at the 8:00 position.  A curvilinear capsulorrhexis was made with a cystotome and capsulorrhexis forceps.  Balanced salt solution was used to hydrodissect and hydrodelineate the nucleus.   Phacoemulsification was then used in stop and chop fashion to remove the lens nucleus and epinucleus.  The remaining cortex was then removed using the irrigation and aspiration handpiece. Healon was then placed into the capsular bag to distend it for lens placement.  A lens was  then injected into the capsular bag.  The remaining viscoelastic was aspirated.   Wounds were hydrated with balanced salt solution.  The anterior chamber was inflated to a physiologic pressure with balanced salt solution.   Intracameral vigamox 0.1 mL undiluted was injected into the eye and a drop placed onto the ocular surface.  No wound leaks were noted.  The patient was taken to the recovery room in stable condition without complications of anesthesia or surgery  Benay Pillow 01/27/2018, 8:03 AM

## 2018-01-27 NOTE — Anesthesia Procedure Notes (Signed)
Procedure Name: MAC Performed by: Bentleigh Stankus, CRNA Pre-anesthesia Checklist: Patient identified, Emergency Drugs available, Suction available, Timeout performed and Patient being monitored Patient Re-evaluated:Patient Re-evaluated prior to induction Oxygen Delivery Method: Nasal cannula Placement Confirmation: positive ETCO2       

## 2018-01-27 NOTE — Anesthesia Preprocedure Evaluation (Signed)
Anesthesia Evaluation  Patient identified by MRN, date of birth, ID band Patient awake    Reviewed: Allergy & Precautions, NPO status , Patient's Chart, lab work & pertinent test results, reviewed documented beta blocker date and time   Airway Mallampati: II       Dental no notable dental hx.    Pulmonary former smoker,    Pulmonary exam normal breath sounds clear to auscultation       Cardiovascular negative cardio ROS Normal cardiovascular exam Rhythm:Regular Rate:Normal     Neuro/Psych negative neurological ROS  negative psych ROS   GI/Hepatic negative GI ROS, Neg liver ROS,   Endo/Other  diabetes  Renal/GU negative Renal ROS     Musculoskeletal negative musculoskeletal ROS (+)   Abdominal Normal abdominal exam  (+)   Peds  Hematology negative hematology ROS (+)   Anesthesia Other Findings   Reproductive/Obstetrics                             Anesthesia Physical Anesthesia Plan  ASA: II  Anesthesia Plan: MAC   Post-op Pain Management:    Induction:   PONV Risk Score and Plan:   Airway Management Planned: Nasal Cannula  Additional Equipment: None  Intra-op Plan:   Post-operative Plan:   Informed Consent: I have reviewed the patients History and Physical, chart, labs and discussed the procedure including the risks, benefits and alternatives for the proposed anesthesia with the patient or authorized representative who has indicated his/her understanding and acceptance.     Plan Discussed with: CRNA, Anesthesiologist and Surgeon  Anesthesia Plan Comments:         Anesthesia Quick Evaluation

## 2018-01-29 ENCOUNTER — Encounter: Payer: Self-pay | Admitting: Ophthalmology

## 2019-04-24 ENCOUNTER — Other Ambulatory Visit: Payer: Self-pay | Admitting: Family Medicine

## 2019-04-24 DIAGNOSIS — Z1231 Encounter for screening mammogram for malignant neoplasm of breast: Secondary | ICD-10-CM

## 2020-01-11 ENCOUNTER — Other Ambulatory Visit: Payer: Self-pay | Admitting: Family Medicine

## 2020-01-11 ENCOUNTER — Ambulatory Visit
Admission: RE | Admit: 2020-01-11 | Discharge: 2020-01-11 | Disposition: A | Discharge status: Home / Self Care | Payer: Medicare Other | Source: Ambulatory Visit | Attending: Family Medicine | Admitting: Family Medicine

## 2020-01-11 ENCOUNTER — Ambulatory Visit
Admission: RE | Admit: 2020-01-11 | Discharge: 2020-01-11 | Disposition: A | Discharge status: Home / Self Care | Payer: Medicare Other | Attending: Family Medicine | Admitting: Family Medicine

## 2020-01-11 DIAGNOSIS — M25561 Pain in right knee: Secondary | ICD-10-CM

## 2020-01-11 DIAGNOSIS — M25562 Pain in left knee: Secondary | ICD-10-CM | POA: Diagnosis present

## 2020-06-16 ENCOUNTER — Other Ambulatory Visit: Payer: Self-pay

## 2020-06-16 ENCOUNTER — Encounter: Payer: Self-pay | Admitting: Internal Medicine

## 2020-06-16 ENCOUNTER — Observation Stay: Payer: Medicare Other

## 2020-06-16 ENCOUNTER — Emergency Department: Payer: Medicare Other

## 2020-06-16 ENCOUNTER — Observation Stay
Admission: EM | Admit: 2020-06-16 | Discharge: 2020-06-17 | Disposition: A | Discharge status: Home / Self Care | Payer: Medicare Other | Attending: Student in an Organized Health Care Education/Training Program | Admitting: Student in an Organized Health Care Education/Training Program

## 2020-06-16 DIAGNOSIS — R1011 Right upper quadrant pain: Secondary | ICD-10-CM | POA: Diagnosis not present

## 2020-06-16 DIAGNOSIS — Z87891 Personal history of nicotine dependence: Secondary | ICD-10-CM | POA: Insufficient documentation

## 2020-06-16 DIAGNOSIS — R0602 Shortness of breath: Secondary | ICD-10-CM | POA: Diagnosis present

## 2020-06-16 DIAGNOSIS — R918 Other nonspecific abnormal finding of lung field: Principal | ICD-10-CM | POA: Diagnosis present

## 2020-06-16 DIAGNOSIS — Z79899 Other long term (current) drug therapy: Secondary | ICD-10-CM | POA: Diagnosis not present

## 2020-06-16 DIAGNOSIS — I10 Essential (primary) hypertension: Secondary | ICD-10-CM | POA: Diagnosis not present

## 2020-06-16 DIAGNOSIS — C3491 Malignant neoplasm of unspecified part of right bronchus or lung: Secondary | ICD-10-CM | POA: Diagnosis not present

## 2020-06-16 DIAGNOSIS — Z7982 Long term (current) use of aspirin: Secondary | ICD-10-CM | POA: Insufficient documentation

## 2020-06-16 DIAGNOSIS — J181 Lobar pneumonia, unspecified organism: Secondary | ICD-10-CM | POA: Diagnosis not present

## 2020-06-16 DIAGNOSIS — E119 Type 2 diabetes mellitus without complications: Secondary | ICD-10-CM | POA: Insufficient documentation

## 2020-06-16 DIAGNOSIS — E1169 Type 2 diabetes mellitus with other specified complication: Secondary | ICD-10-CM | POA: Diagnosis present

## 2020-06-16 DIAGNOSIS — Z20822 Contact with and (suspected) exposure to covid-19: Secondary | ICD-10-CM | POA: Insufficient documentation

## 2020-06-16 DIAGNOSIS — J189 Pneumonia, unspecified organism: Secondary | ICD-10-CM

## 2020-06-16 LAB — COMPREHENSIVE METABOLIC PANEL
ALT: 22 U/L (ref 0–44)
AST: 18 U/L (ref 15–41)
Albumin: 4.3 g/dL (ref 3.5–5.0)
Alkaline Phosphatase: 74 U/L (ref 38–126)
Anion gap: 10 (ref 5–15)
BUN: 17 mg/dL (ref 8–23)
CO2: 29 mmol/L (ref 22–32)
Calcium: 9.4 mg/dL (ref 8.9–10.3)
Chloride: 99 mmol/L (ref 98–111)
Creatinine, Ser: 0.63 mg/dL (ref 0.44–1.00)
GFR, Estimated: >60 mL/min (ref >60)
Glucose, Bld: 308 mg/dL — ABNORMAL HIGH (ref 70–99)
Potassium: 4.1 mmol/L (ref 3.5–5.1)
Sodium: 138 mmol/L (ref 135–145)
Total Bilirubin: 0.5 mg/dL (ref 0.3–1.2)
Total Protein: 7.8 g/dL (ref 6.5–8.1)

## 2020-06-16 LAB — CBC WITH DIFFERENTIAL/PLATELET
Abs Immature Granulocytes: 0.01 10*3/uL (ref 0.00–0.07)
Basophils Absolute: 0 10*3/uL (ref 0.0–0.1)
Basophils Relative: 0 %
Eosinophils Absolute: 0.2 10*3/uL (ref 0.0–0.5)
Eosinophils Relative: 3 %
HCT: 43.3 % (ref 36.0–46.0)
Hemoglobin: 14.6 g/dL (ref 12.0–15.0)
Immature Granulocytes: 0 %
Lymphocytes Relative: 30 %
Lymphs Abs: 1.7 10*3/uL (ref 0.7–4.0)
MCH: 31.5 pg (ref 26.0–34.0)
MCHC: 33.7 g/dL (ref 30.0–36.0)
MCV: 93.5 fL (ref 80.0–100.0)
Monocytes Absolute: 0.3 10*3/uL (ref 0.1–1.0)
Monocytes Relative: 6 %
Neutro Abs: 3.4 10*3/uL (ref 1.7–7.7)
Neutrophils Relative %: 61 %
Platelets: 289 10*3/uL (ref 150–400)
RBC: 4.63 MIL/uL (ref 3.87–5.11)
RDW: 12.6 % (ref 11.5–15.5)
WBC: 5.6 10*3/uL (ref 4.0–10.5)
nRBC: 0 % (ref 0.0–0.2)

## 2020-06-16 LAB — SARS CORONAVIRUS 2 BY RT PCR (HOSPITAL ORDER, PERFORMED IN ~~LOC~~ HOSPITAL LAB): SARS Coronavirus 2: NEGATIVE

## 2020-06-16 LAB — CBG MONITORING, ED
Glucose-Capillary: 311 mg/dL — ABNORMAL HIGH (ref 70–99)
Glucose-Capillary: 381 mg/dL — ABNORMAL HIGH (ref 70–99)

## 2020-06-16 LAB — URINALYSIS, COMPLETE (UACMP) WITH MICROSCOPIC
Bacteria, UA: NONE SEEN
Bilirubin Urine: NEGATIVE
Glucose, UA: >=500 mg/dL — AB
Ketones, ur: NEGATIVE mg/dL
Leukocytes,Ua: NEGATIVE
Nitrite: NEGATIVE
Protein, ur: NEGATIVE mg/dL
Specific Gravity, Urine: 1.021 (ref 1.005–1.030)
pH: 5 (ref 5.0–8.0)

## 2020-06-16 LAB — LIPASE, BLOOD: Lipase: 57 U/L — ABNORMAL HIGH (ref 11–51)

## 2020-06-16 LAB — PROCALCITONIN: Procalcitonin: <0.1 ng/mL

## 2020-06-16 LAB — TROPONIN I (HIGH SENSITIVITY)
Troponin I (High Sensitivity): 5 ng/L (ref <18)
Troponin I (High Sensitivity): 5 ng/L (ref <18)

## 2020-06-16 LAB — TSH: TSH: 1.947 u[IU]/mL (ref 0.350–4.500)

## 2020-06-16 LAB — PHOSPHORUS: Phosphorus: 3.5 mg/dL (ref 2.5–4.6)

## 2020-06-16 MED ORDER — INSULIN ASPART 100 UNIT/ML ~~LOC~~ SOLN
0.0000 [IU] | Freq: Three times a day (TID) | SUBCUTANEOUS | Status: DC
  Administered 2020-06-17: 2 [IU] via SUBCUTANEOUS
  Administered 2020-06-17: 7 [IU] via SUBCUTANEOUS
  Filled 2020-06-16 (×2): qty 1

## 2020-06-16 MED ORDER — AZITHROMYCIN 500 MG PO TABS
500.0000 mg | ORAL_TABLET | Freq: Once | ORAL | Status: AC
  Administered 2020-06-16: 500 mg via ORAL
  Filled 2020-06-16: qty 1

## 2020-06-16 MED ORDER — IPRATROPIUM-ALBUTEROL 0.5-2.5 (3) MG/3ML IN SOLN
3.0000 mL | Freq: Once | RESPIRATORY_TRACT | Status: AC
  Administered 2020-06-16: 3 mL via RESPIRATORY_TRACT
  Filled 2020-06-16: qty 3

## 2020-06-16 MED ORDER — ONDANSETRON HCL 4 MG/2ML IJ SOLN: 4.0000 mg | Freq: Four times a day (QID) | INTRAMUSCULAR | Status: DC | PRN

## 2020-06-16 MED ORDER — METFORMIN HCL 500 MG PO TABS: 1000.0000 mg | ORAL_TABLET | Freq: Two times a day (BID) | ORAL | Status: DC

## 2020-06-16 MED ORDER — METHYLPREDNISOLONE SODIUM SUCC 125 MG IJ SOLR
125.0000 mg | Freq: Once | INTRAMUSCULAR | Status: AC
  Administered 2020-06-16: 125 mg via INTRAVENOUS
  Filled 2020-06-16: qty 2

## 2020-06-16 MED ORDER — ENOXAPARIN SODIUM 40 MG/0.4ML ~~LOC~~ SOLN
40.0000 mg | SUBCUTANEOUS | Status: DC
  Administered 2020-06-16: 40 mg via SUBCUTANEOUS
  Filled 2020-06-16: qty 0.4

## 2020-06-16 MED ORDER — ONDANSETRON HCL 4 MG PO TABS: 4.0000 mg | ORAL_TABLET | Freq: Four times a day (QID) | ORAL | Status: DC | PRN

## 2020-06-16 MED ORDER — FENTANYL CITRATE (PF) 100 MCG/2ML IJ SOLN: 25.0000 ug | INTRAMUSCULAR | Status: DC | PRN

## 2020-06-16 MED ORDER — ADULT MULTIVITAMIN W/MINERALS CH
1.0000 | ORAL_TABLET | Freq: Every day | ORAL | Status: DC
  Administered 2020-06-17: 1 via ORAL
  Filled 2020-06-16: qty 1

## 2020-06-16 MED ORDER — LABETALOL HCL 5 MG/ML IV SOLN: 10.0000 mg | INTRAVENOUS | Status: DC | PRN

## 2020-06-16 MED ORDER — SIMVASTATIN 20 MG PO TABS
40.0000 mg | ORAL_TABLET | Freq: Every day | ORAL | Status: DC
Start: 2020-06-17 — End: 2020-06-17
  Administered 2020-06-17: 40 mg via ORAL
  Filled 2020-06-16: qty 2

## 2020-06-16 MED ORDER — LISINOPRIL 10 MG PO TABS: 20.0000 mg | ORAL_TABLET | Freq: Every day | ORAL | Status: DC

## 2020-06-16 MED ORDER — SODIUM CHLORIDE 0.9 % IV BOLUS
1000.0000 mL | Freq: Once | INTRAVENOUS | Status: AC
  Administered 2020-06-16: 1000 mL via INTRAVENOUS

## 2020-06-16 MED ORDER — SODIUM CHLORIDE 0.9 % IV SOLN
1.0000 g | Freq: Once | INTRAVENOUS | Status: AC
  Administered 2020-06-16: 1 g via INTRAVENOUS
  Filled 2020-06-16: qty 10

## 2020-06-16 MED ORDER — MORPHINE SULFATE (PF) 2 MG/ML IV SOLN
1.0000 mg | INTRAVENOUS | Status: DC | PRN
  Filled 2020-06-16: qty 1

## 2020-06-16 MED ORDER — ACETAMINOPHEN 650 MG RE SUPP: 650.0000 mg | Freq: Four times a day (QID) | RECTAL | Status: DC | PRN

## 2020-06-16 MED ORDER — HYDRALAZINE HCL 25 MG PO TABS
25.0000 mg | ORAL_TABLET | Freq: Three times a day (TID) | ORAL | Status: DC | PRN
  Administered 2020-06-16: 25 mg via ORAL
  Filled 2020-06-16: qty 1

## 2020-06-16 MED ORDER — LINAGLIPTIN 5 MG PO TABS
5.0000 mg | ORAL_TABLET | Freq: Every day | ORAL | Status: DC
Start: 2020-06-16 — End: 2020-06-17

## 2020-06-16 MED ORDER — LISINOPRIL 5 MG PO TABS: 2.5000 mg | ORAL_TABLET | Freq: Every day | ORAL | Status: DC

## 2020-06-16 MED ORDER — ASPIRIN EC 81 MG PO TBEC
81.0000 mg | DELAYED_RELEASE_TABLET | Freq: Every day | ORAL | Status: DC
  Administered 2020-06-17: 81 mg via ORAL
  Filled 2020-06-16: qty 1

## 2020-06-16 MED ORDER — ADULT MULTIVITAMIN W/MINERALS CH
1.0000 | ORAL_TABLET | Freq: Every day | ORAL | Status: DC
  Filled 2020-06-16: qty 1

## 2020-06-16 MED ORDER — IOHEXOL 350 MG/ML SOLN
75.0000 mL | Freq: Once | INTRAVENOUS | Status: AC | PRN
  Administered 2020-06-16: 75 mL via INTRAVENOUS
  Filled 2020-06-16: qty 75

## 2020-06-16 MED ORDER — ACETAMINOPHEN 325 MG PO TABS: 650.0000 mg | ORAL_TABLET | Freq: Four times a day (QID) | ORAL | Status: DC | PRN

## 2020-06-16 MED ORDER — LABETALOL HCL 5 MG/ML IV SOLN
10.0000 mg | INTRAVENOUS | Status: DC | PRN
  Administered 2020-06-16: 10 mg via INTRAVENOUS
  Filled 2020-06-16: qty 4

## 2020-06-16 MED ORDER — INSULIN ASPART 100 UNIT/ML ~~LOC~~ SOLN
0.0000 [IU] | Freq: Every day | SUBCUTANEOUS | Status: DC
  Administered 2020-06-16: 5 [IU] via SUBCUTANEOUS
  Filled 2020-06-16: qty 1

## 2020-06-16 MED ORDER — LIDOCAINE 5 % EX PTCH
2.0000 | MEDICATED_PATCH | CUTANEOUS | Status: DC
  Administered 2020-06-16: 2 via TRANSDERMAL
  Filled 2020-06-16 (×2): qty 2

## 2020-06-16 NOTE — ED Triage Notes (Signed)
Pt here with right side rib and back pain. Pt states that when she breathes in she is in a lot of pain. Pt in NAD in triage.

## 2020-06-16 NOTE — ED Notes (Signed)
Pt declined covid testing.
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
## 2020-06-16 NOTE — ED Provider Notes (Addendum)
MSE was initiated and I personally evaluated the patient and placed orders (if any) at  11:16 AM on June 16, 2020.  The patient appears stable so that the remainder of the MSE may be completed by another provider.  Patient appears stable, c/o right rib and ruq pain, cough, no fever or chills,    Lungs c t a, ribs nontender, ruq nontender   Versie Starks, PA-C 06/16/20 1117  cbg was 311, ordered ns 1 L   Versie Starks, PA-C 06/16/20 1134    Merlyn Lot, MD 06/16/20 1313

## 2020-06-17 ENCOUNTER — Encounter: Payer: Self-pay | Admitting: Internal Medicine

## 2020-06-17 DIAGNOSIS — R918 Other nonspecific abnormal finding of lung field: Secondary | ICD-10-CM

## 2020-06-17 DIAGNOSIS — E1169 Type 2 diabetes mellitus with other specified complication: Secondary | ICD-10-CM | POA: Diagnosis not present

## 2020-06-17 LAB — COMPREHENSIVE METABOLIC PANEL
ALT: 25 U/L (ref 0–44)
AST: 25 U/L (ref 15–41)
Albumin: 4.3 g/dL (ref 3.5–5.0)
Alkaline Phosphatase: 76 U/L (ref 38–126)
Anion gap: 13 (ref 5–15)
BUN: 21 mg/dL (ref 8–23)
CO2: 26 mmol/L (ref 22–32)
Calcium: 9.5 mg/dL (ref 8.9–10.3)
Chloride: 100 mmol/L (ref 98–111)
Creatinine, Ser: 0.79 mg/dL (ref 0.44–1.00)
GFR, Estimated: >60 mL/min (ref >60)
Glucose, Bld: 348 mg/dL — ABNORMAL HIGH (ref 70–99)
Potassium: 4.3 mmol/L (ref 3.5–5.1)
Sodium: 139 mmol/L (ref 135–145)
Total Bilirubin: 0.4 mg/dL (ref 0.3–1.2)
Total Protein: 8.5 g/dL — ABNORMAL HIGH (ref 6.5–8.1)

## 2020-06-17 LAB — CBC
HCT: 43.5 % (ref 36.0–46.0)
Hemoglobin: 14.7 g/dL (ref 12.0–15.0)
MCH: 31.6 pg (ref 26.0–34.0)
MCHC: 33.8 g/dL (ref 30.0–36.0)
MCV: 93.5 fL (ref 80.0–100.0)
Platelets: 322 10*3/uL (ref 150–400)
RBC: 4.65 MIL/uL (ref 3.87–5.11)
RDW: 12.3 % (ref 11.5–15.5)
WBC: 7.8 10*3/uL (ref 4.0–10.5)
nRBC: 0 % (ref 0.0–0.2)

## 2020-06-17 LAB — GLUCOSE, CAPILLARY
Glucose-Capillary: 330 mg/dL — ABNORMAL HIGH (ref 70–99)
Glucose-Capillary: 194 mg/dL — ABNORMAL HIGH (ref 70–99)

## 2020-06-17 LAB — HEMOGLOBIN A1C
Hgb A1c MFr Bld: 9 % — ABNORMAL HIGH (ref 4.8–5.6)
Mean Plasma Glucose: 211.6 mg/dL

## 2020-06-17 MED ORDER — LISINOPRIL 20 MG PO TABS
20.0000 mg | ORAL_TABLET | Freq: Every day | ORAL | Status: DC
  Administered 2020-06-17: 20 mg via ORAL
  Filled 2020-06-17: qty 1

## 2020-06-17 MED ORDER — LINAGLIPTIN 5 MG PO TABS
5.0000 mg | ORAL_TABLET | Freq: Every day | ORAL | Status: DC
  Administered 2020-06-17: 5 mg via ORAL
  Filled 2020-06-17: qty 1

## 2020-06-17 NOTE — Progress Notes (Signed)
Pt discharged home with husband. Pt took all belongings. Went over discharge instructions with patient and she showed understanding. IV taken out of Left AC without complications.

## 2020-06-17 NOTE — Discharge Summary (Addendum)
Physician Discharge Summary  Tiffany Mann:[id number] DOB: October 31, 1945 DOA: 06/16/2020  PCP: Donnie Coffin, MD  Admit date: 06/16/2020 Discharge date: 06/17/2020  Discharge disposition: Home   Recommendations for Outpatient Follow-Up:   Follow-up with Dr. Lanney Gins, pulmonologist, in 3 to 4 days   Discharge Diagnosis:   Principal Problem:   Right lower lobe lung mass Active Problems:   Type 2 diabetes mellitus with other specified complication [facility])    Discharge Condition: Stable.  Diet recommendation:  Diet Order            Diet - low sodium heart healthy           Diet heart healthy/carb modified Room service appropriate? Yes; Fluid consistency: Thin  Diet effective now                   Code Status: Full Code     Hospital Course:   Ms. Tiffany Mann is a 75 y.o. female with medical history significant  for type II DM, hypertension, hyperlipidemia, who presented to the hospital with progressively worsening right-sided abdominal pain of about 2 weeks duration.  She was found to have mass in her right lung concerning for bronchogenic carcinoma with direct mediastinal invasion and osseous metastasis.  There was also mild thoracic adenopathy concerning for nodal metastasis.  She was not hypoxic.  Pulmonologist was consulted to assist with management.  Pulmonologist recommended outpatient follow-up for further evaluation.  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  Patient said she has no history of liver cirrhosis.  She was advised to follow-up with PCP for further work-up for this.  She had severe hyperglycemia requiring treatment with insulin.  Hemoglobin A1c is 9.0.  Outpatient follow-up with PCP for adequate glucose control was recommended  She said her abdominal pain has improved.  She has no complaints and she is deemed stable for discharge to home today.  Discharge plan was discussed with the patient and her  husband over the phone.    Medical Consultants:    Pulmonologist, Dr. Lanney Gins    Discharge Exam:    Vitals:   06/16/20 2300 06/17/20 0205 06/17/20 0212 06/17/20 0807  BP: (!) 171/79  (!) 155/74 (!) 169/75  Pulse: 84  84 82  Resp: 17  18 17   Temp:   98.1 F (36.7 C) 98.6 F (37 C)  TempSrc:      SpO2: 94%  95% 98%  Weight:  71.7 kg    Height:  5\' 4"  (1.626 m)       GEN: NAD SKIN: No rash EYES: EOMI ENT: MMM CV: RRR PULM: CTA B ABD: soft, ND, NT, +BS CNS: AAO x 3, non focal EXT: No edema or tenderness   The results of significant diagnostics from this hospitalization (including imaging, microbiology, ancillary and laboratory) are listed below for reference.     Procedures and Diagnostic Studies:   DG Chest 2 View  Result Date: 06/16/2020 CLINICAL DATA:  Cough and chest pain EXAM: CHEST - 2 VIEW COMPARISON:  None. FINDINGS: There is airspace opacity in the superior segment right lower lobe. There is atelectatic change in the bases. Heart size and pulmonary vascularity are normal. No adenopathy appreciable by radiography. There is aortic atherosclerosis. There is degenerative change in each shoulder. IMPRESSION: Airspace opacity consistent with pneumonia in the superior segment right lower lobe. Mild bibasilar atelectasis. Heart size within normal limits. Aortic Atherosclerosis (ICD10-I70.0). Electronically Signed   By:  Lowella Grip III M.D.   On: 06/16/2020 12:29   CT Angio Chest PE W and/or Wo Contrast  Result Date: 06/16/2020 CLINICAL DATA:  Shortness of breath for the past 2 weeks. Right-sided back pain. No COVID vaccines. Evaluate for pulmonary embolism. EXAM: CT ANGIOGRAPHY CHEST WITH CONTRAST TECHNIQUE: Multidetector CT imaging of the chest was performed using the standard protocol during bolus administration of intravenous contrast. Multiplanar CT image reconstructions and MIPs were obtained to evaluate the vascular anatomy. CONTRAST:  1mL OMNIPAQUE  IOHEXOL 350 MG/ML SOLN COMPARISON:  Plain film of earlier today.  No prior CT. FINDINGS: Cardiovascular: The quality of this exam for evaluation of pulmonary embolism is good. No evidence of pulmonary embolism. Aortic atherosclerosis. Mild cardiomegaly. Multivessel coronary artery atherosclerosis. Mediastinum/Nodes: No supraclavicular adenopathy. 1.0 cm right paratracheal node on 23/4 is borderline enlarged. Direct primary extension into the mediastinum on 40/4. Tumor extension versus adenopathy in the right hilum at 1.4 cm on 38/4. AP window/prevascular node of 11 mm on 29/4. Lungs/Pleura: Small minimally loculated right right pleural effusion. Near complete obstruction of the right lower lobe bronchus. Mild centrilobular emphysema. Central superior segment right lower lobe lung mass with direct mediastinal invasion. On the order of 5.6 x 4.8 cm on 44/4, 6.2 cm craniocaudal on coronal image 75/9. Nonspecific 3 mm right upper lobe pulmonary nodule on 33/6. Upper Abdomen: Probable cirrhosis, mild as evidenced by caudate and lateral segment left liver lobe enlargement. Underlying hepatic steatosis. Normal imaged portions of the spleen, stomach, adrenal glands, left kidney. Musculoskeletal: Right scapular lytic lesion is ill-defined including on 25/4. Remote lower posterior right rib fractures, likely posttraumatic. Tiny lytic lesion involving the cortex of the right lateral seventh rib on 47/4. Review of the MIP images confirms the above findings. IMPRESSION: 1. 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 Recommend multidisciplinary thoracic oncology consultation for eventual PET. 2. No evidence of pulmonary embolism. 3. Mild thoracic adenopathy, suspicious for nodal metastasis. 4. Small right pleural effusion. 5. Probable cirrhosis. 6. Aortic atherosclerosis (ICD10-I70.0), coronary artery atherosclerosis and emphysema  (ICD10-J43.9). Electronically Signed   By: Abigail Miyamoto M.D.   On: 06/16/2020 16:12     Labs:   Basic Metabolic Panel: Recent Labs  Lab 06/16/20 1135 06/16/20 2025 06/17/20 0347  NA 138  --  139  K 4.1  --  4.3  CL 99  --  100  CO2 29  --  26  GLUCOSE 308*  --  348*  BUN 17  --  21  CREATININE 0.63  --  0.79  CALCIUM 9.4  --  9.5  PHOS  --  3.5  --    GFR Estimated Creatinine Clearance: 61.7 mL/min (by C-G formula based on SCr of 0.79 mg/dL). Liver Function Tests: Recent Labs  Lab 06/16/20 1135 06/17/20 0347  AST 18 25  ALT 22 25  ALKPHOS 74 76  BILITOT 0.5 0.4  PROT 7.8 8.5*  ALBUMIN 4.3 4.3   Recent Labs  Lab 06/16/20 1135  LIPASE 57*   No results for input(s): AMMONIA in the last 168 hours. Coagulation profile No results for input(s): INR, PROTIME in the last 168 hours.  CBC: Recent Labs  Lab 06/16/20 1135 06/17/20 0347  WBC 5.6 7.8  NEUTROABS 3.4  --   HGB 14.6 14.7  HCT 43.3 43.5  MCV 93.5 93.5  PLT 289 322   Cardiac Enzymes: No results for input(s): CKTOTAL, CKMB, CKMBINDEX, TROPONINI in  the last 168 hours. BNP: Invalid input(s): POCBNP CBG: Recent Labs  Lab 06/16/20 1130 06/16/20 2030 06/17/20 0928 06/17/20 1211  GLUCAP 311* 381* 330* 194*   D-Dimer No results for input(s): DDIMER in the last 72 hours. Hgb A1c Recent Labs    06/16/20 2025  HGBA1C 9.0*   Lipid Profile No results for input(s): CHOL, HDL, LDLCALC, TRIG, CHOLHDL, LDLDIRECT in the last 72 hours. Thyroid function studies Recent Labs    06/16/20 2025  TSH 1.947   Anemia work up No results for input(s): VITAMINB12, FOLATE, FERRITIN, TIBC, IRON, RETICCTPCT in the last 72 hours. Microbiology Recent Results (from the past 240 hour(s))  SARS Coronavirus 2 by RT PCR (hospital order, performed in [facility] hospital lab) Nasopharyngeal Nasopharyngeal Swab     Status: None   Collection Time: 06/16/20  2:20 PM   Specimen: Nasopharyngeal Swab  Result Value Ref Range  Status   SARS Coronavirus 2 NEGATIVE NEGATIVE Final    Comment: (NOTE) SARS-CoV-2 target nucleic acids are NOT DETECTED.  The SARS-CoV-2 RNA is generally detectable in upper and lower respiratory specimens during the acute phase of infection. The lowest concentration of SARS-CoV-2 viral copies this assay can detect is 250 copies / mL. A negative result does not preclude SARS-CoV-2 infection and should not be used as the sole basis for treatment or other patient management decisions.  A negative result may occur with improper specimen collection / handling, submission of specimen other than nasopharyngeal swab, presence of viral mutation(s) within the areas targeted by this assay, and inadequate number of viral copies (<250 copies / mL). A negative result must be combined with clinical observations, patient history, and epidemiological information.  Fact Sheet for Patients:   StrictlyIdeas.no  Fact Sheet for Healthcare Providers: BankingDealers.co.za  This test is not yet approved or  cleared by the [place] FDA and has been authorized for detection and/or diagnosis of SARS-CoV-2 by FDA under an Emergency Use Authorization (EUA).  This EUA will remain in effect (meaning this test can be used) for the duration of the COVID-19 declaration under Section 564(b)(1) of the Act, 21 U.S.C. section 360bbb-3(b)(1), unless the authorization is terminated or revoked sooner.  Performed at [facility], [address], [place], [place] [address]      Discharge Instructions:   Discharge Instructions    Diet - low sodium heart healthy   Complete by: As directed    Increase activity slowly   Complete by: As directed      Allergies as of 06/17/2020   No Known Allergies     Medication List    STOP taking these medications   ibuprofen 800 MG tablet Commonly known as: ADVIL     TAKE these medications   glipiZIDE 10 MG 24  hr tablet Commonly known as: GLUCOTROL XL Take 10 mg by mouth daily with breakfast. Notes to patient: Not given while in hospital   linagliptin 5 MG Tabs tablet Commonly known as: TRADJENTA Take 5 mg by mouth daily. Notes to patient: Last dose given 06/17/20 9:37am   lisinopril 2.5 MG tablet Commonly known as: ZESTRIL Take 2.5 mg by mouth daily. Notes to patient: Last dose given 06/17/20 9:37am   meclizine 25 MG tablet Commonly known as: ANTIVERT Take 25 mg by mouth 3 (three) times daily as needed for dizziness. Notes to patient: Not given while in hospital    metFORMIN 1000 MG tablet Commonly known as: GLUCOPHAGE Take 1,000 mg by mouth 2 (two) times daily  with a meal. Notes to patient: Not given while in hospital    multivitamin tablet Take 1 tablet by mouth daily. Notes to patient: Last dose given 06/17/20 9:37am       Follow-up Information    Ottie Glazier, MD. Schedule an appointment as soon as possible for a visit in 5 days.   Specialty: Pulmonary Disease Why: Patient has to set appt as New Patient Contact information: [place] [address] [phone]                Time coordinating discharge: 32 minutes  Signed:  Jennye Boroughs  [facility] 06/17/2020, 3:28 PM   Pager on [email or web address]. If 7PM-7AM, please contact night-coverage at [email or web address]

## 2020-06-17 NOTE — Progress Notes (Signed)
Bedside spirometry performed and placed on chart.
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
## 2020-06-17 NOTE — Plan of Care (Signed)

## 2020-06-24 ENCOUNTER — Other Ambulatory Visit: Payer: Self-pay | Admitting: Pulmonary Disease

## 2020-06-24 DIAGNOSIS — R918 Other nonspecific abnormal finding of lung field: Secondary | ICD-10-CM

## 2020-06-29 ENCOUNTER — Other Ambulatory Visit: Payer: Self-pay

## 2020-06-29 ENCOUNTER — Encounter
Admission: RE | Admit: 2020-06-29 | Discharge: 2020-06-29 | Disposition: A | Discharge status: Home / Self Care | Payer: Medicare Other | Source: Ambulatory Visit | Attending: Pulmonary Disease | Admitting: Pulmonary Disease

## 2020-06-29 HISTORY — DX: Unspecified cirrhosis of liver: K74.60

## 2020-06-29 NOTE — Pre-Procedure Instructions (Signed)
Via Secure chat with Dr Lanney Gins, OK to not repeat CBC and EKG, PT/PTT on DOS.

## 2020-06-29 NOTE — Patient Instructions (Signed)
Your procedure is scheduled on: 07/06/20 Report to [facility]. STOP AT ADMITTING DESK ON 1ST FLOOR To find out your arrival time please call [phone] between 1PM - 3PM on 07/05/20.  Remember: Instructions that are not followed completely may result in serious medical risk, up to and including death, or upon the discretion of your surgeon and anesthesiologist your surgery may need to be rescheduled.     _X__ 1. Do not eat food after midnight the night before your procedure.                 No gum chewing or hard candies. You may drink clear liquids up to 2 hours                 before you are scheduled to arrive for your surgery- DO not drink clear                 liquids within 2 hours of the start of your surgery.                 Clear Liquids include:  water, apple juice without pulp, clear carbohydrate                 drink such as Clearfast or Gatorade, Black Coffee or Tea (Do not add                 anything to coffee or tea). Diabetics water only  __X__2.  On the morning of surgery brush your teeth with toothpaste and water, you                 may rinse your mouth with mouthwash if you wish.  Do not swallow any              toothpaste of mouthwash.     _X__ 3.  No Alcohol for 24 hours before or after surgery.   _X__ 4.  Do Not Smoke or use e-cigarettes For 24 Hours Prior to Your Surgery.                 Do not use any chewable tobacco products for at least 6 hours prior to                 surgery.  ____  5.  Bring all medications with you on the day of surgery if instructed.   __X__  6.  Notify your doctor if there is any change in your medical condition      (cold, fever, infections).     Do not wear jewelry, make-up, hairpins, clips or nail polish. Do not wear lotions, powders, or perfumes.  Do not shave 48 hours prior to surgery. Men may shave face and neck. Do not bring valuables to the hospital.    [facility] is  not responsible for any belongings or valuables.  Contacts, dentures/partials or body piercings may not be worn into surgery. Bring a case for your contacts, glasses or hearing aids, a denture cup will be supplied. Leave your suitcase in the car. After surgery it may be brought to your room. For patients admitted to the hospital, discharge time is determined by your treatment team.   Patients discharged the day of surgery will not be allowed to drive home.   Please read over the following fact sheets that you were given:     __X__ Take these medicines the morning of surgery with  A SIP OF WATER:    1. NONE  2.   3.   4.  5.  6.  ____ Fleet Enema (as directed)   __X__ Use CHG Soap/SAGE wipes as directed  ____ Use inhalers on the day of surgery  __X__ Stop metformin/Janumet/Farxiga 2 days prior to surgery  LAST DOSE Sunday NIGHT  ____ Take 1/2 of usual insulin dose the night before surgery. No insulin the morning          of surgery.   ____ Stop Blood Thinners Coumadin/Plavix/Xarelto/Pleta/Pradaxa/Eliquis/Effient/Aspirin  on   Or contact your Surgeon, Cardiologist or Medical Doctor regarding  ability to stop your blood thinners  __X__ Stop Anti-inflammatories 7 days before surgery such as Advil, Ibuprofen, Motrin,  BC or Goodies Powder, Naprosyn, Naproxen, Aleve, Aspirin    __X__ Stop all herbal supplements, fish oil or vitamin E until after surgery.  STOP TODAY 06/29/20  ____ Bring C-Pap to the hospital.

## 2020-07-04 ENCOUNTER — Other Ambulatory Visit: Payer: Self-pay

## 2020-07-04 ENCOUNTER — Other Ambulatory Visit
Admission: RE | Admit: 2020-07-04 | Discharge: 2020-07-04 | Disposition: A | Discharge status: Home / Self Care | Payer: Medicare Other | Source: Ambulatory Visit | Attending: Pulmonary Disease | Admitting: Pulmonary Disease

## 2020-07-04 DIAGNOSIS — Z01812 Encounter for preprocedural laboratory examination: Secondary | ICD-10-CM | POA: Insufficient documentation

## 2020-07-04 DIAGNOSIS — Z20822 Contact with and (suspected) exposure to covid-19: Secondary | ICD-10-CM | POA: Diagnosis not present

## 2020-07-05 LAB — SARS CORONAVIRUS 2 (TAT 6-24 HRS): SARS Coronavirus 2: NEGATIVE

## 2020-07-05 MED ORDER — SODIUM CHLORIDE 0.9 % IV SOLN: INTRAVENOUS | Status: DC

## 2020-07-05 MED ORDER — FAMOTIDINE 20 MG PO TABS: 20.0000 mg | ORAL_TABLET | Freq: Once | ORAL | Status: AC

## 2020-07-05 MED ORDER — ORAL CARE MOUTH RINSE: 15.0000 mL | Freq: Once | OROMUCOSAL | Status: AC

## 2020-07-05 MED ORDER — CHLORHEXIDINE GLUCONATE 0.12 % MT SOLN: 15.0000 mL | Freq: Once | OROMUCOSAL | Status: AC

## 2020-07-06 ENCOUNTER — Ambulatory Visit: Payer: Medicare Other | Admitting: Anesthesiology

## 2020-07-06 ENCOUNTER — Encounter
Admission: RE | Disposition: A | Discharge status: Home / Self Care | Payer: Self-pay | Source: Home / Self Care | Attending: Pulmonary Disease

## 2020-07-06 ENCOUNTER — Other Ambulatory Visit: Payer: Self-pay | Admitting: Pulmonary Disease

## 2020-07-06 ENCOUNTER — Ambulatory Visit
Admission: RE | Admit: 2020-07-06 | Discharge: 2020-07-06 | Disposition: A | Discharge status: Home / Self Care | Payer: Medicare Other | Source: Ambulatory Visit | Attending: Pulmonary Disease | Admitting: Pulmonary Disease

## 2020-07-06 ENCOUNTER — Ambulatory Visit
Admission: RE | Admit: 2020-07-06 | Discharge: 2020-07-06 | Disposition: A | Discharge status: Home / Self Care | Payer: Medicare Other | Attending: Pulmonary Disease | Admitting: Pulmonary Disease

## 2020-07-06 ENCOUNTER — Other Ambulatory Visit: Payer: Self-pay

## 2020-07-06 ENCOUNTER — Ambulatory Visit: Payer: Medicare Other

## 2020-07-06 DIAGNOSIS — Z90710 Acquired absence of both cervix and uterus: Secondary | ICD-10-CM | POA: Diagnosis not present

## 2020-07-06 DIAGNOSIS — E785 Hyperlipidemia, unspecified: Secondary | ICD-10-CM | POA: Insufficient documentation

## 2020-07-06 DIAGNOSIS — E119 Type 2 diabetes mellitus without complications: Secondary | ICD-10-CM | POA: Diagnosis not present

## 2020-07-06 DIAGNOSIS — K7460 Unspecified cirrhosis of liver: Secondary | ICD-10-CM | POA: Insufficient documentation

## 2020-07-06 DIAGNOSIS — C3431 Malignant neoplasm of lower lobe, right bronchus or lung: Secondary | ICD-10-CM | POA: Diagnosis not present

## 2020-07-06 DIAGNOSIS — Z87891 Personal history of nicotine dependence: Secondary | ICD-10-CM | POA: Diagnosis not present

## 2020-07-06 DIAGNOSIS — R918 Other nonspecific abnormal finding of lung field: Secondary | ICD-10-CM | POA: Diagnosis present

## 2020-07-06 DIAGNOSIS — I10 Essential (primary) hypertension: Secondary | ICD-10-CM | POA: Insufficient documentation

## 2020-07-06 DIAGNOSIS — R590 Localized enlarged lymph nodes: Secondary | ICD-10-CM | POA: Diagnosis not present

## 2020-07-06 HISTORY — PX: VIDEO BRONCHOSCOPY WITH ENDOBRONCHIAL NAVIGATION: SHX6175

## 2020-07-06 HISTORY — PX: VIDEO BRONCHOSCOPY WITH ENDOBRONCHIAL ULTRASOUND: SHX6177

## 2020-07-06 LAB — GLUCOSE, CAPILLARY
Glucose-Capillary: 191 mg/dL — ABNORMAL HIGH (ref 70–99)
Glucose-Capillary: 193 mg/dL — ABNORMAL HIGH (ref 70–99)

## 2020-07-06 LAB — APTT: aPTT: 32 seconds (ref 24–36)

## 2020-07-06 LAB — PROTIME-INR
INR: 1 (ref 0.8–1.2)
Prothrombin Time: 12.6 seconds (ref 11.4–15.2)

## 2020-07-06 SURGERY — BRONCHOSCOPY, WITH EBUS
Anesthesia: General

## 2020-07-06 MED ORDER — SUGAMMADEX SODIUM 500 MG/5ML IV SOLN
INTRAVENOUS | Status: AC
  Filled 2020-07-06: qty 5

## 2020-07-06 MED ORDER — SUCCINYLCHOLINE CHLORIDE 20 MG/ML IJ SOLN
INTRAMUSCULAR | Status: DC | PRN
  Administered 2020-07-06: 80 mg via INTRAVENOUS

## 2020-07-06 MED ORDER — FENTANYL CITRATE (PF) 100 MCG/2ML IJ SOLN
INTRAMUSCULAR | Status: DC | PRN
  Administered 2020-07-06 (×2): 50 ug via INTRAVENOUS

## 2020-07-06 MED ORDER — SUGAMMADEX SODIUM 500 MG/5ML IV SOLN
INTRAVENOUS | Status: DC | PRN
  Administered 2020-07-06: 300 mg via INTRAVENOUS

## 2020-07-06 MED ORDER — SODIUM CHLORIDE 0.9 % IV SOLN
INTRAVENOUS | Status: DC | PRN
  Administered 2020-07-06: 25 ug/min via INTRAVENOUS

## 2020-07-06 MED ORDER — BUTAMBEN-TETRACAINE-BENZOCAINE 2-2-14 % EX AERO
1.0000 | INHALATION_SPRAY | Freq: Once | CUTANEOUS | Status: DC
  Filled 2020-07-06: qty 20

## 2020-07-06 MED ORDER — ONDANSETRON HCL 4 MG/2ML IJ SOLN: 4.0000 mg | Freq: Once | INTRAMUSCULAR | Status: DC | PRN

## 2020-07-06 MED ORDER — ONDANSETRON HCL 4 MG/2ML IJ SOLN
INTRAMUSCULAR | Status: DC | PRN
  Administered 2020-07-06: 4 mg via INTRAVENOUS

## 2020-07-06 MED ORDER — LIDOCAINE HCL URETHRAL/MUCOSAL 2 % EX GEL
1.0000 "application " | Freq: Once | CUTANEOUS | Status: DC
  Filled 2020-07-06: qty 5

## 2020-07-06 MED ORDER — FENTANYL CITRATE (PF) 100 MCG/2ML IJ SOLN
INTRAMUSCULAR | Status: AC
  Filled 2020-07-06: qty 2

## 2020-07-06 MED ORDER — ROCURONIUM BROMIDE 100 MG/10ML IV SOLN
INTRAVENOUS | Status: DC | PRN
  Administered 2020-07-06: 10 mg via INTRAVENOUS
  Administered 2020-07-06: 50 mg via INTRAVENOUS

## 2020-07-06 MED ORDER — SUCCINYLCHOLINE CHLORIDE 200 MG/10ML IV SOSY
PREFILLED_SYRINGE | INTRAVENOUS | Status: AC
  Filled 2020-07-06: qty 10

## 2020-07-06 MED ORDER — FENTANYL CITRATE (PF) 100 MCG/2ML IJ SOLN: 25.0000 ug | INTRAMUSCULAR | Status: DC | PRN

## 2020-07-06 MED ORDER — FAMOTIDINE 20 MG PO TABS
ORAL_TABLET | ORAL | Status: AC
  Administered 2020-07-06: 20 mg via ORAL
  Filled 2020-07-06: qty 1

## 2020-07-06 MED ORDER — PROPOFOL 10 MG/ML IV BOLUS
INTRAVENOUS | Status: AC
  Filled 2020-07-06: qty 20

## 2020-07-06 MED ORDER — LIDOCAINE HCL (PF) 2 % IJ SOLN
INTRAMUSCULAR | Status: AC
  Filled 2020-07-06: qty 5

## 2020-07-06 MED ORDER — ROCURONIUM BROMIDE 10 MG/ML (PF) SYRINGE
PREFILLED_SYRINGE | INTRAVENOUS | Status: AC
  Filled 2020-07-06: qty 10

## 2020-07-06 MED ORDER — GLYCOPYRROLATE 0.2 MG/ML IJ SOLN
INTRAMUSCULAR | Status: DC | PRN
  Administered 2020-07-06: .2 mg via INTRAVENOUS

## 2020-07-06 MED ORDER — LIDOCAINE HCL (CARDIAC) PF 100 MG/5ML IV SOSY
PREFILLED_SYRINGE | INTRAVENOUS | Status: DC | PRN
  Administered 2020-07-06: 60 mg via INTRAVENOUS

## 2020-07-06 MED ORDER — PROPOFOL 10 MG/ML IV BOLUS
INTRAVENOUS | Status: DC | PRN
  Administered 2020-07-06: 120 mg via INTRAVENOUS

## 2020-07-06 MED ORDER — GLYCOPYRROLATE 0.2 MG/ML IJ SOLN
INTRAMUSCULAR | Status: AC
  Filled 2020-07-06: qty 1

## 2020-07-06 MED ORDER — ONDANSETRON HCL 4 MG/2ML IJ SOLN
INTRAMUSCULAR | Status: AC
  Filled 2020-07-06: qty 4

## 2020-07-06 MED ORDER — CHLORHEXIDINE GLUCONATE 0.12 % MT SOLN
OROMUCOSAL | Status: AC
  Administered 2020-07-06: 15 mL via OROMUCOSAL
  Filled 2020-07-06: qty 15

## 2020-07-06 MED ORDER — DEXAMETHASONE SODIUM PHOSPHATE 10 MG/ML IJ SOLN
INTRAMUSCULAR | Status: DC | PRN
  Administered 2020-07-06: 10 mg via INTRAVENOUS

## 2020-07-06 MED ORDER — PHENYLEPHRINE HCL 0.25 % NA SOLN
1.0000 | Freq: Four times a day (QID) | NASAL | Status: DC | PRN
  Filled 2020-07-06: qty 15

## 2020-07-06 NOTE — Anesthesia Procedure Notes (Addendum)
Procedure Name: Intubation Performed by: Kelton Pillar, CRNA Pre-anesthesia Checklist: Patient identified, Emergency Drugs available, Suction available and Patient being monitored Patient Re-evaluated:Patient Re-evaluated prior to induction Oxygen Delivery Method: Circle system utilized Preoxygenation: Pre-oxygenation with 100% oxygen Induction Type: IV induction Ventilation: Mask ventilation without difficulty Laryngoscope Size: McGraph and 3 Grade View: Grade I Tube type: Oral Tube size: 8.5 mm Number of attempts: 1 Airway Equipment and Method: Stylet and Oral airway Placement Confirmation: ETT inserted through vocal cords under direct vision,  positive ETCO2,  breath sounds checked- equal and bilateral and CO2 detector Secured at: 21 cm Tube secured with: Tape Dental Injury: Teeth and Oropharynx as per pre-operative assessment
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
## 2020-07-06 NOTE — Procedures (Signed)
ELECTROMAGNETIC NAVIGATIONAL BRONCHOSCOPY PROCEDURE NOTE  FIBEROPTIC BRONCHOSCOPY WITH BRONCHOALVEOLAR LAVAGE PROCEDURE NOTE  ENDOBRONCHIAL ULTRASOUND PROCEDURE NOTE    Flexible bronchoscopy was performed  by : Lanney Gins MD  assistance by : 1)Repiratory therapist  and 2)[employer] [facility] staff and 3) Anesthesia team and 4) Flouroscopy team   Indication for the procedure was :  Pre-procedural H&P. The following assessment was performed on the day of the procedure prior to initiating sedation History:  Chest pain n Dyspnea y Hemoptysis n Cough y Fever n Other pertinent items n  Examination Vital signs -reviewed as per nursing documentation today Cardiac    Murmurs: n  Rubs : n  Gallop: n Lungs Wheezing: n Rales : n Rhonchi :y  Other pertinent findings: SOB/hypoxemia due to chronic lung disease   Pre-procedural assessment for Procedural Sedation included: Depth of sedation: As per anesthesia team  ASA Classification:  2 Mallampati airway assessment: 3    Medication list reviewed: y  The patient's interval history was taken and revealed: no new complaints The pre- procedure physical examination revealed: No new findings Refer to prior clinic note for details.  Informed Consent: Informed consent was obtained from:  patient after explanation of procedure and risks, benefits, as well as alternative procedures available.  Explanation of level of sedation and possible transfusion was also provided.    Procedural Preparation: Time out was performed and patient was identified by name and birthdate and procedure to be performed and side for sampling, if any, was specified. Pt was intubated by anesthesia.  The patient was appropriately draped.   Fiberoptic bronchoscopy with airway inspection and BAL Procedure findings:  Bronchoscope was inserted via ETT  without difficulty.  Posterior oropharynx, epiglottis, arytenoids, false cords and vocal cords were not visualized as  these were bypassed by endotracheal tube. The distal trachea was normal in circumference and appearance without mucosal, cartilaginous or branching abnormalities.  The main carina was mildly splayed . All right and left lobar airways were visualized to the Subsegmental level.  Sub- sub segmental carinae were identified in all the distal airways.   Secretions were visible in the following airways and appeared to be clear.  The mucosa was : friable at RLL with extrinsic compression of basal segments  Airways were notable for:        exophytic lesions :n       extrinsic compression in the following distributions: n.       Friable mucosa: y       Neurosurgeon /pigmentation: n     Post procedure Diagnosis:   Right lower lobe crowding with extrinsic compression or basal segments     Electromagnetic Navigational Bronchoscopy Procedure Findings:  After appropriate CT-guided planning ENB scope was advanced via endotracheal tube and LG was advanced for registration.  Post appropriate planning and registration peripheral navigation was used to visualize target lesion.    SPECIMENS - CYTOBRUSH X 3                         - BAL X 1                         - FNA NEEDLE BIOPSY X 1                         - SURGICAL BIOPSY X 7    Post procedure diagnosis:       LUNG CARCINOMA  Endobronchial ultrasound assisted hilar and mediastinal lymph node biopsies procedure findings: The fiberoptic bronchoscope was removed and the EBUS scope was introduced. Examination began to evaluate for pathologically enlarged lymph nodes starting on the LEFT side progressing to the RIGHT side.  All lymph node biopsies performed with 22G needle. Lymph node biopsies were sent in cytolite for all stations.   Post procedure diagnosis:  METASTATIC LUNG CARCINOMA                   Cytology brushes : RLL + ATYPICAL CELLS   Broncho-alveolar lavage site:RLL   sent for CYTOLOGY                             40  ml volume infused 20 ml volume returned with [facility] appearance    Fluoroscopy Used: 2.1 MIN  ;           Immediate sampling complications included: N Epinephrine N ml was used topically  The bronchoscopy was terminated due to completion of the planned procedure and the bronchoscope was removed.   Total dosage of Lidocaine was 11m Total fluoroscopy time was 2.1 minutes   Estimated Blood loss: 2-3 cc.  Complications included:  NONE IMMEDIATE    Disposition: Home , is poke with Mr LDonnal Debar(next of kin and friend of patient) he will accomodate patient to follow up appt and is ready to take her home  Follow up with Dr. ALanney Ginsin 5 days for result discussion.     FOttie GlazierMD  KCross VillageDivision of Pulmonary & Critical Care Medicine

## 2020-07-06 NOTE — Transfer of Care (Signed)
Immediate Anesthesia Transfer of Care Note  Patient: Tiffany Mann  Procedure(s) Performed: VIDEO BRONCHOSCOPY WITH ENDOBRONCHIAL ULTRASOUND (N/A ) VIDEO BRONCHOSCOPY WITH ENDOBRONCHIAL NAVIGATION (N/A )  Patient Location: PACU  Anesthesia Type:General  Level of Consciousness: awake, drowsy and patient cooperative  Airway & Oxygen Therapy: Patient Spontanous Breathing and Patient connected to face mask oxygen  Post-op Assessment: Report given to RN and Post -op Vital signs reviewed and stable  Post vital signs: Reviewed and stable  Last Vitals:  Vitals Value Taken Time  BP 137/63 07/06/20 1419  Temp 36.1 C 07/06/20 1419  Pulse 75 07/06/20 1421  Resp 22 07/06/20 1420  SpO2 100 % 07/06/20 1421  Vitals shown include unvalidated device data.  Last Pain: There were no vitals filed for this visit.       Complications: No complications documented.

## 2020-07-06 NOTE — Discharge Instructions (Signed)
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
## 2020-07-07 ENCOUNTER — Encounter: Payer: Self-pay | Admitting: Pulmonary Disease

## 2020-07-07 NOTE — Anesthesia Postprocedure Evaluation (Signed)
Anesthesia Post Note  Patient: Tiffany Mann  Procedure(s) Performed: VIDEO BRONCHOSCOPY WITH ENDOBRONCHIAL ULTRASOUND (N/A ) VIDEO BRONCHOSCOPY WITH ENDOBRONCHIAL NAVIGATION (N/A )  Patient location during evaluation: PACU Anesthesia Type: General Level of consciousness: awake and alert Pain management: pain level controlled Vital Signs Assessment: post-procedure vital signs reviewed and stable Respiratory status: spontaneous breathing, nonlabored ventilation, respiratory function stable and patient connected to nasal cannula oxygen Cardiovascular status: blood pressure returned to baseline and stable Postop Assessment: no apparent nausea or vomiting Anesthetic complications: no   No complications documented.   Last Vitals:  Vitals:   07/06/20 1456 07/06/20 1507  BP: (!) 119/52 (!) 129/56  Pulse: 71 71  Resp: 13 16  Temp: (!) 36.4 C (!) 36.4 C  SpO2: 94% 92%    Last Pain:  Vitals:   07/06/20 1507  TempSrc: Temporal  PainSc: 0-No pain                 Molli Barrows

## 2020-07-08 ENCOUNTER — Other Ambulatory Visit: Payer: Self-pay | Admitting: Anatomic Pathology & Clinical Pathology

## 2020-07-08 LAB — CYTOLOGY - NON PAP

## 2020-07-08 LAB — SURGICAL PATHOLOGY

## 2020-07-11 ENCOUNTER — Encounter: Payer: Self-pay | Admitting: *Deleted

## 2020-07-12 ENCOUNTER — Other Ambulatory Visit: Payer: Self-pay

## 2020-07-12 ENCOUNTER — Ambulatory Visit
Admission: RE | Admit: 2020-07-12 | Discharge: 2020-07-12 | Disposition: A | Discharge status: Home / Self Care | Payer: Medicare Other | Source: Ambulatory Visit | Attending: Pulmonary Disease | Admitting: Pulmonary Disease

## 2020-07-12 DIAGNOSIS — J90 Pleural effusion, not elsewhere classified: Secondary | ICD-10-CM | POA: Diagnosis not present

## 2020-07-12 DIAGNOSIS — I2510 Atherosclerotic heart disease of native coronary artery without angina pectoris: Secondary | ICD-10-CM | POA: Insufficient documentation

## 2020-07-12 DIAGNOSIS — E041 Nontoxic single thyroid nodule: Secondary | ICD-10-CM | POA: Insufficient documentation

## 2020-07-12 DIAGNOSIS — R918 Other nonspecific abnormal finding of lung field: Secondary | ICD-10-CM | POA: Diagnosis present

## 2020-07-12 DIAGNOSIS — I700 Atherosclerosis of aorta: Secondary | ICD-10-CM | POA: Diagnosis not present

## 2020-07-12 LAB — GLUCOSE, CAPILLARY: Glucose-Capillary: 147 mg/dL — ABNORMAL HIGH (ref 70–99)

## 2020-07-12 MED ORDER — GADOBUTROL 1 MMOL/ML IV SOLN
7.0000 mL | Freq: Once | INTRAVENOUS | Status: AC | PRN
  Administered 2020-07-12: 7 mL via INTRAVENOUS

## 2020-07-12 MED ORDER — FLUDEOXYGLUCOSE F - 18 (FDG) INJECTION
8.1000 | Freq: Once | INTRAVENOUS | Status: AC | PRN
  Administered 2020-07-12: 8.49 via INTRAVENOUS

## 2020-07-14 ENCOUNTER — Other Ambulatory Visit: Payer: Medicare Other

## 2020-07-14 NOTE — Progress Notes (Signed)
Tumor Board Documentation  CHIARA COLTRIN was presented by Dr Tasia Catchings at our Tumor Board on 07/14/2020, which included representatives from medical oncology,radiation oncology,internal medicine,navigation,pathology,radiology,surgical,pharmacy,genetics,research,palliative care,pulmonology.  Tristin currently presents as a new patient,for MDC,for new positive pathology with history of the following treatments: surgical intervention(s).  Additionally, we reviewed previous medical and familial history, history of present illness, and recent lab results along with all available histopathologic and imaging studies. The tumor board considered available treatment options and made the following recommendations: Immunotherapy,Additional screening (Send Liquid Biopsy)    The following procedures/referrals were also placed: No orders of the defined types were placed in this encounter.   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  Tumor board is a meeting of clinicians from various specialty areas who evaluate and discuss patients for whom a multidisciplinary approach is being considered. Final determinations in the plan of care are those of the provider(s). The responsibility for follow up of recommendations given during tumor board is that of the provider.   Today's extended care, comprehensive team conference, [place] was not present for the discussion and was not examined.   Multidisciplinary Tumor Board is a multidisciplinary case peer review process.  Decisions discussed in the Multidisciplinary Tumor Board reflect the opinions of the specialists present at the conference without having examined the patient.  Ultimately, treatment and diagnostic decisions rest with the primary provider(s) and the patient.

## 2020-07-15 ENCOUNTER — Encounter: Payer: Self-pay | Admitting: Oncology

## 2020-07-15 ENCOUNTER — Encounter: Payer: Self-pay | Admitting: *Deleted

## 2020-07-15 ENCOUNTER — Inpatient Hospital Stay: Payer: Medicare Other | Attending: Oncology | Admitting: Oncology

## 2020-07-15 ENCOUNTER — Inpatient Hospital Stay: Payer: Medicare Other

## 2020-07-15 VITALS — BP 150/71 | HR 73 | Temp 97.4°F | Ht 64.5 in | Wt 154.3 lb

## 2020-07-15 DIAGNOSIS — E041 Nontoxic single thyroid nodule: Secondary | ICD-10-CM | POA: Insufficient documentation

## 2020-07-15 DIAGNOSIS — E119 Type 2 diabetes mellitus without complications: Secondary | ICD-10-CM | POA: Insufficient documentation

## 2020-07-15 DIAGNOSIS — J90 Pleural effusion, not elsewhere classified: Secondary | ICD-10-CM | POA: Insufficient documentation

## 2020-07-15 DIAGNOSIS — R590 Localized enlarged lymph nodes: Secondary | ICD-10-CM | POA: Diagnosis not present

## 2020-07-15 DIAGNOSIS — R519 Headache, unspecified: Secondary | ICD-10-CM

## 2020-07-15 DIAGNOSIS — Z833 Family history of diabetes mellitus: Secondary | ICD-10-CM | POA: Insufficient documentation

## 2020-07-15 DIAGNOSIS — K760 Fatty (change of) liver, not elsewhere classified: Secondary | ICD-10-CM | POA: Diagnosis not present

## 2020-07-15 DIAGNOSIS — Z8759 Personal history of other complications of pregnancy, childbirth and the puerperium: Secondary | ICD-10-CM | POA: Diagnosis not present

## 2020-07-15 DIAGNOSIS — C7951 Secondary malignant neoplasm of bone: Secondary | ICD-10-CM | POA: Diagnosis not present

## 2020-07-15 DIAGNOSIS — E042 Nontoxic multinodular goiter: Secondary | ICD-10-CM | POA: Diagnosis not present

## 2020-07-15 DIAGNOSIS — I2510 Atherosclerotic heart disease of native coronary artery without angina pectoris: Secondary | ICD-10-CM | POA: Insufficient documentation

## 2020-07-15 DIAGNOSIS — I700 Atherosclerosis of aorta: Secondary | ICD-10-CM | POA: Diagnosis not present

## 2020-07-15 DIAGNOSIS — Z79899 Other long term (current) drug therapy: Secondary | ICD-10-CM

## 2020-07-15 DIAGNOSIS — G893 Neoplasm related pain (acute) (chronic): Secondary | ICD-10-CM

## 2020-07-15 DIAGNOSIS — I517 Cardiomegaly: Secondary | ICD-10-CM | POA: Diagnosis not present

## 2020-07-15 DIAGNOSIS — Z8261 Family history of arthritis: Secondary | ICD-10-CM | POA: Diagnosis not present

## 2020-07-15 DIAGNOSIS — Z87891 Personal history of nicotine dependence: Secondary | ICD-10-CM | POA: Diagnosis not present

## 2020-07-15 DIAGNOSIS — C3491 Malignant neoplasm of unspecified part of right bronchus or lung: Secondary | ICD-10-CM

## 2020-07-15 DIAGNOSIS — K429 Umbilical hernia without obstruction or gangrene: Secondary | ICD-10-CM | POA: Insufficient documentation

## 2020-07-15 DIAGNOSIS — Z800 Family history of malignant neoplasm of digestive organs: Secondary | ICD-10-CM | POA: Diagnosis not present

## 2020-07-15 DIAGNOSIS — Z7189 Other specified counseling: Secondary | ICD-10-CM | POA: Insufficient documentation

## 2020-07-15 DIAGNOSIS — J439 Emphysema, unspecified: Secondary | ICD-10-CM | POA: Diagnosis not present

## 2020-07-15 DIAGNOSIS — C3431 Malignant neoplasm of lower lobe, right bronchus or lung: Secondary | ICD-10-CM | POA: Diagnosis present

## 2020-07-15 LAB — CBC WITH DIFFERENTIAL/PLATELET
Abs Immature Granulocytes: 0.02 10*3/uL (ref 0.00–0.07)
Basophils Absolute: 0 10*3/uL (ref 0.0–0.1)
Basophils Relative: 0 %
Eosinophils Absolute: 0.3 10*3/uL (ref 0.0–0.5)
Eosinophils Relative: 4 %
HCT: 41.6 % (ref 36.0–46.0)
Hemoglobin: 14.1 g/dL (ref 12.0–15.0)
Immature Granulocytes: 0 %
Lymphocytes Relative: 31 %
Lymphs Abs: 2.1 10*3/uL (ref 0.7–4.0)
MCH: 31.6 pg (ref 26.0–34.0)
MCHC: 33.9 g/dL (ref 30.0–36.0)
MCV: 93.3 fL (ref 80.0–100.0)
Monocytes Absolute: 0.4 10*3/uL (ref 0.1–1.0)
Monocytes Relative: 6 %
Neutro Abs: 3.9 10*3/uL (ref 1.7–7.7)
Neutrophils Relative %: 59 %
Platelets: 272 10*3/uL (ref 150–400)
RBC: 4.46 MIL/uL (ref 3.87–5.11)
RDW: 12.7 % (ref 11.5–15.5)
WBC: 6.8 10*3/uL (ref 4.0–10.5)
nRBC: 0 % (ref 0.0–0.2)

## 2020-07-15 LAB — COMPREHENSIVE METABOLIC PANEL
ALT: 23 U/L (ref 0–44)
AST: 19 U/L (ref 15–41)
Albumin: 4.4 g/dL (ref 3.5–5.0)
Alkaline Phosphatase: 54 U/L (ref 38–126)
Anion gap: 11 (ref 5–15)
BUN: 19 mg/dL (ref 8–23)
CO2: 27 mmol/L (ref 22–32)
Calcium: 9.3 mg/dL (ref 8.9–10.3)
Chloride: 101 mmol/L (ref 98–111)
Creatinine, Ser: 0.72 mg/dL (ref 0.44–1.00)
GFR, Estimated: >60 mL/min (ref >60)
Glucose, Bld: 131 mg/dL — ABNORMAL HIGH (ref 70–99)
Potassium: 4.4 mmol/L (ref 3.5–5.1)
Sodium: 139 mmol/L (ref 135–145)
Total Bilirubin: 0.5 mg/dL (ref 0.3–1.2)
Total Protein: 7.8 g/dL (ref 6.5–8.1)

## 2020-07-15 LAB — LACTATE DEHYDROGENASE: LDH: 135 U/L (ref 98–192)
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
## 2020-07-15 NOTE — Progress Notes (Signed)
Patient here to establish care  

## 2020-07-15 NOTE — Progress Notes (Signed)
  Oncology Nurse Navigator Documentation  Navigator Location: [facility]Med [facility] (07/15/20 1200) Referral Date to RadOnc/MedOnc: 07/12/20 (07/15/20 1200) )Navigator Encounter Type: Clinic/MDC (07/15/20 1200)   Abnormal Finding Date: 06/16/20 (07/15/20 1200) Confirmed Diagnosis Date: 07/08/20 (07/15/20 1200)         Multidisiplinary Clinic Date: 07/15/20 (07/15/20 1200) Multidisiplinary Clinic Type: Thoracic (07/15/20 1200)   Patient Visit Type: MedOnc (07/15/20 1200) Treatment Phase: Pre-Tx/Tx Discussion (07/15/20 1200) Barriers/Navigation Needs: Coordination of Care (07/15/20 1200)   Interventions: Coordination of Care (07/15/20 1200)   Coordination of Care: Appts;Pathology (07/15/20 1200)     met with patient during initial consult with Dr. Tasia Catchings to discuss pathology results and treatment options. All questions answered during visit. Informed pt that she will be notified with follow up appt once results from liquid biopsy have been reported. Liquid biopsy collected and sent to [place] today. Pt given contact info and instructed to call with any questions or needs. Pt verbalized understanding.             Time Spent with Patient: 60 (07/15/20 1200)

## 2020-07-26 ENCOUNTER — Encounter: Payer: Self-pay | Admitting: Oncology

## 2020-07-26 ENCOUNTER — Other Ambulatory Visit: Payer: Self-pay

## 2020-07-26 ENCOUNTER — Encounter: Payer: Self-pay | Admitting: *Deleted

## 2020-07-26 ENCOUNTER — Telehealth: Payer: Self-pay | Admitting: Pharmacist

## 2020-07-26 ENCOUNTER — Inpatient Hospital Stay: Payer: Medicare Other | Admitting: Pharmacist

## 2020-07-26 ENCOUNTER — Inpatient Hospital Stay: Payer: Medicare Other | Attending: Oncology | Admitting: Oncology

## 2020-07-26 VITALS — BP 169/75 | HR 77 | Temp 97.4°F | Resp 16 | Wt 153.6 lb

## 2020-07-26 DIAGNOSIS — C7951 Secondary malignant neoplasm of bone: Secondary | ICD-10-CM | POA: Diagnosis not present

## 2020-07-26 DIAGNOSIS — C3490 Malignant neoplasm of unspecified part of unspecified bronchus or lung: Secondary | ICD-10-CM | POA: Diagnosis not present

## 2020-07-26 DIAGNOSIS — R590 Localized enlarged lymph nodes: Secondary | ICD-10-CM | POA: Insufficient documentation

## 2020-07-26 DIAGNOSIS — C3431 Malignant neoplasm of lower lobe, right bronchus or lung: Secondary | ICD-10-CM | POA: Diagnosis present

## 2020-07-26 DIAGNOSIS — R0602 Shortness of breath: Secondary | ICD-10-CM | POA: Insufficient documentation

## 2020-07-26 DIAGNOSIS — E041 Nontoxic single thyroid nodule: Secondary | ICD-10-CM | POA: Diagnosis not present

## 2020-07-26 DIAGNOSIS — I2510 Atherosclerotic heart disease of native coronary artery without angina pectoris: Secondary | ICD-10-CM | POA: Insufficient documentation

## 2020-07-26 DIAGNOSIS — Z7189 Other specified counseling: Secondary | ICD-10-CM | POA: Diagnosis not present

## 2020-07-26 DIAGNOSIS — E042 Nontoxic multinodular goiter: Secondary | ICD-10-CM | POA: Insufficient documentation

## 2020-07-26 DIAGNOSIS — Z87891 Personal history of nicotine dependence: Secondary | ICD-10-CM | POA: Diagnosis not present

## 2020-07-26 DIAGNOSIS — Z8261 Family history of arthritis: Secondary | ICD-10-CM | POA: Insufficient documentation

## 2020-07-26 DIAGNOSIS — K429 Umbilical hernia without obstruction or gangrene: Secondary | ICD-10-CM | POA: Insufficient documentation

## 2020-07-26 DIAGNOSIS — R197 Diarrhea, unspecified: Secondary | ICD-10-CM | POA: Diagnosis not present

## 2020-07-26 DIAGNOSIS — I700 Atherosclerosis of aorta: Secondary | ICD-10-CM | POA: Insufficient documentation

## 2020-07-26 DIAGNOSIS — C3491 Malignant neoplasm of unspecified part of right bronchus or lung: Secondary | ICD-10-CM | POA: Diagnosis not present

## 2020-07-26 DIAGNOSIS — J439 Emphysema, unspecified: Secondary | ICD-10-CM | POA: Diagnosis not present

## 2020-07-26 DIAGNOSIS — Z833 Family history of diabetes mellitus: Secondary | ICD-10-CM | POA: Insufficient documentation

## 2020-07-26 DIAGNOSIS — J90 Pleural effusion, not elsewhere classified: Secondary | ICD-10-CM | POA: Diagnosis not present

## 2020-07-26 DIAGNOSIS — R059 Cough, unspecified: Secondary | ICD-10-CM | POA: Diagnosis not present

## 2020-07-26 DIAGNOSIS — Z800 Family history of malignant neoplasm of digestive organs: Secondary | ICD-10-CM | POA: Diagnosis not present

## 2020-07-26 DIAGNOSIS — Z79899 Other long term (current) drug therapy: Secondary | ICD-10-CM | POA: Diagnosis not present

## 2020-07-26 DIAGNOSIS — G893 Neoplasm related pain (acute) (chronic): Secondary | ICD-10-CM | POA: Insufficient documentation

## 2020-07-26 DIAGNOSIS — I517 Cardiomegaly: Secondary | ICD-10-CM | POA: Diagnosis not present

## 2020-07-26 DIAGNOSIS — K7460 Unspecified cirrhosis of liver: Secondary | ICD-10-CM | POA: Insufficient documentation

## 2020-07-26 NOTE — Progress Notes (Signed)
  Oncology Nurse Navigator Documentation  Navigator Location: [facility] (07/26/20 1400)   )Navigator Encounter Type: Follow-up Appt (07/26/20 1400)                     Patient Visit Type: MedOnc (07/26/20 1400)   Barriers/Navigation Needs: Coordination of Care (07/26/20 1400)   Interventions: Coordination of Care;Referrals (07/26/20 1400) Referrals: Other (07/26/20 1400) Coordination of Care: Appts (07/26/20 1400)        Acuity: Level 2-Minimal Needs (1-2 Barriers Identified) (07/26/20 1400)    met with patient during follow up visit with Dr. Tasia Catchings to discuss liquid biopsy results. All questions answered during visit. Pt stated that she is interested in home health aide to help her while her friend, Marchia Bond, goes for surgery. Pt given information and informed that these services are usually not billable through insurance and she may have to pay out of pocket. Copy of liquid biopsy results given to patient. Instructed pt to call with any further questions or needs. Pt verbalized understanding.     Time Spent with Patient: 30 (07/26/20 1400)
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
## 2020-07-26 NOTE — Progress Notes (Signed)
Patient reports that her wheeze and cough are stable but the cough is not producing as much phlegm.  Also has occasional neck pain that is hurting now at 4/10 on pain scale.

## 2020-07-26 NOTE — Telephone Encounter (Signed)
Oral Oncology Pharmacist Encounter  Received new prescription for Tagrisso (osimertinib) for the treatment of newly diagnosed stage IV NSCLC, EGFR mutation positive, planned duration until disease progression or unacceptable drug toxicity.  Prescription dose and frequency assessed. QTc on 06/16/20, 456 msec. In chart review, did not see a documented hx of cardiovascular disease. Reviewed her current med list and she is not on any QTc prolonging medications. No additional QTc follow-up needed at this time.  Current medication list in Epic reviewed, no DDIs with osimertinib identified.  Evaluated chart and no patient barriers to medication adherence identified.   Oral Oncology Clinic will continue to follow for insurance authorization, copayment issues, initial counseling and start date.   Darl Pikes, PharmD, BCPS, BCOP, CPP Hematology/Oncology Clinical Pharmacist Practitioner [facility] [place] [facility] [phone]  07/26/2020 4:29 PM

## 2020-07-26 NOTE — Progress Notes (Signed)
START ON PATHWAY REGIMEN - Non-Small Cell Lung     A cycle is every 28 days:     Osimertinib   **Always confirm dose/schedule in your pharmacy ordering system**  Patient Characteristics: Stage IV Metastatic, Nonsquamous, Molecular Analysis Completed, Molecular Alteration Present and Eligible for Molecular Targeted Therapy, Initial Molecular Targeted Therapy, EGFR Mutation - Common (Exon 19 Deletion or Exon 21 L858R Substitution) Therapeutic Status: Stage IV Metastatic Histology: Nonsquamous Cell Broad Molecular Profiling Status: Molecular Analysis Completed Molecular Analysis Results: Alteration Present and Eligible for Molecular Targeted Therapy Molecular Alteration Present: EGFR Mutation - Common (Exon 19 Deletion or Exon 21 L858R Substitution) Molecular Targeted Line of Therapy: Initial Molecular Targeted Therapy Intent of Therapy: Non-Curative / Palliative Intent, Discussed with Patient 
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
## 2020-07-27 ENCOUNTER — Other Ambulatory Visit (HOSPITAL_COMMUNITY): Payer: Self-pay | Admitting: Oncology

## 2020-07-27 ENCOUNTER — Telehealth: Payer: Self-pay | Admitting: Pharmacy Technician

## 2020-07-27 MED ORDER — OSIMERTINIB MESYLATE 80 MG PO TABS: 80.0000 mg | ORAL_TABLET | Freq: Every day | ORAL | 3 refills | Status: DC

## 2020-07-27 NOTE — Telephone Encounter (Signed)
Oral Oncology [place] Advocate Encounter  Received notification from [facility] that prior authorization for Tagrisso is required.  PA submitted on CoverMyMeds Key [id number]  Status is pending  Oral Oncology Clinic will continue to follow.  Tiffany Mann [place] Phone [phone] Fax [phone] 07/27/2020 9:56 AM

## 2020-07-27 NOTE — Telephone Encounter (Signed)
Patient agreed to treatment on 07/27/20 per MD documentation.

## 2020-07-27 NOTE — Telephone Encounter (Signed)
Oral Oncology Patient Advocate Encounter  Prior Authorization for [name] has been approved.    PA# [id number]  Effective dates: 07/27/20 through 01/23/21  Patients co-pay is $4.00.  Oral Oncology Clinic will continue to follow.   [place] Phone [phone] Fax [phone] 07/27/2020 9:57 AM

## 2020-07-28 MED FILL — TAGRISSO 80 MG TABLET: 80 | 30 days supply | Qty: 30 | Fill #0

## 2020-07-28 NOTE — Telephone Encounter (Signed)
Oral Oncology Patient Advocate Encounter  I spoke with Tiffany Mann this afternoon to set up delivery of Tagrisso.  Address verified for shipment.  [name] will be filled through [facility] and mailed 07/28/20 for delivery 07/29/20.    [name] will call 7-10 days before next refill is due to complete adherence call and set up delivery of medication.     [name] Phone [phone] Fax [phone] 07/28/2020 3:08 PM

## 2020-07-29 ENCOUNTER — Telehealth: Payer: Self-pay | Admitting: Pharmacist

## 2020-07-29 NOTE — Telephone Encounter (Signed)
Oral Chemotherapy Pharmacist Encounter   Patient will be receiving her Tagrisso today. Called and spoke with Marchia Bond, they know to get started on the [place] today. Reviewed potential side effects to look out for when getting started (rash and diarrhea).   Tiffany Mann asked if Tiffany Mann could take melatonin while on Tagrisso. There is no interaction between Tiffany Mann and melatonin.  Darl Pikes, PharmD, BCPS, BCOP, CPP Hematology/Oncology Clinical Pharmacist [facility] Oral [place] [facility] [phone]  07/29/2020 9:29 AM

## 2020-08-01 ENCOUNTER — Encounter: Payer: Self-pay | Admitting: *Deleted

## 2020-08-01 ENCOUNTER — Other Ambulatory Visit: Payer: Self-pay

## 2020-08-01 DIAGNOSIS — C3491 Malignant neoplasm of unspecified part of right bronchus or lung: Secondary | ICD-10-CM

## 2020-08-01 NOTE — Progress Notes (Signed)
  Oncology Nurse Navigator Documentation  Navigator Location: [facility] (08/01/20 0900)   )Navigator Encounter Type: Appt/Treatment Plan Review (08/01/20 0900)                   Treatment Initiated Date: 07/29/20 (08/01/20 0900)   Treatment Phase: Active Tx (08/01/20 0900) Barriers/Navigation Needs: No Barriers At This Time;No Needs (08/01/20 0900)   Interventions: None Required (08/01/20 0900)                      Time Spent with Patient: 15 (08/01/20 0900)

## 2020-08-03 ENCOUNTER — Inpatient Hospital Stay: Payer: Medicare Other | Admitting: *Deleted

## 2020-08-03 DIAGNOSIS — C3490 Malignant neoplasm of unspecified part of unspecified bronchus or lung: Secondary | ICD-10-CM

## 2020-08-04 NOTE — Progress Notes (Signed)
Multidisciplinary Oncology Council Documentation  Tiffany Mann was presented by our [facility] on 08/03/2020, which included representatives from:  . Palliative Care . Dietitian  . Physical/Occupational Therapist . Nurse Navigator . Genetics . Speech Therapist . Social work . Survivorship RN . Hotel manager . Research RN . [place] Representative  [place] currently presents with history of lung cancer.  We reviewed previous medical and familial history, history of present illness, and recent lab results along with all available histopathologic and imaging studies. The [place] considered available treatment options and made the following recommendations/referrals:  - None at this time.   The MOC is a meeting of clinicians from various specialty areas who evaluate and discuss patients for whom a multidisciplinary approach is being considered. Final determinations in the plan of care are those of the provider(s).   Today's extended care, comprehensive team conference, Tiffany Mann was not present for the discussion and was not examined.

## 2020-08-09 ENCOUNTER — Other Ambulatory Visit (HOSPITAL_BASED_OUTPATIENT_CLINIC_OR_DEPARTMENT_OTHER): Payer: Self-pay

## 2020-08-11 ENCOUNTER — Inpatient Hospital Stay: Payer: Medicare Other

## 2020-08-11 ENCOUNTER — Encounter: Payer: Self-pay | Admitting: *Deleted

## 2020-08-11 ENCOUNTER — Encounter: Payer: Self-pay | Admitting: Oncology

## 2020-08-11 ENCOUNTER — Inpatient Hospital Stay: Payer: Medicare Other | Admitting: Pharmacist

## 2020-08-11 ENCOUNTER — Inpatient Hospital Stay (HOSPITAL_BASED_OUTPATIENT_CLINIC_OR_DEPARTMENT_OTHER): Payer: Medicare Other | Admitting: Oncology

## 2020-08-11 VITALS — BP 127/65 | HR 72 | Temp 97.3°F | Resp 18 | Wt 151.8 lb

## 2020-08-11 DIAGNOSIS — C7951 Secondary malignant neoplasm of bone: Secondary | ICD-10-CM | POA: Diagnosis not present

## 2020-08-11 DIAGNOSIS — C3490 Malignant neoplasm of unspecified part of unspecified bronchus or lung: Secondary | ICD-10-CM | POA: Diagnosis not present

## 2020-08-11 DIAGNOSIS — C3491 Malignant neoplasm of unspecified part of right bronchus or lung: Secondary | ICD-10-CM

## 2020-08-11 DIAGNOSIS — Z5111 Encounter for antineoplastic chemotherapy: Secondary | ICD-10-CM | POA: Insufficient documentation

## 2020-08-11 DIAGNOSIS — C3431 Malignant neoplasm of lower lobe, right bronchus or lung: Secondary | ICD-10-CM | POA: Diagnosis not present

## 2020-08-11 LAB — COMPREHENSIVE METABOLIC PANEL
ALT: 16 U/L (ref 0–44)
AST: 18 U/L (ref 15–41)
Albumin: 4.5 g/dL (ref 3.5–5.0)
Alkaline Phosphatase: 58 U/L (ref 38–126)
Anion gap: 9 (ref 5–15)
BUN: 17 mg/dL (ref 8–23)
CO2: 27 mmol/L (ref 22–32)
Calcium: 9.4 mg/dL (ref 8.9–10.3)
Chloride: 104 mmol/L (ref 98–111)
Creatinine, Ser: 0.81 mg/dL (ref 0.44–1.00)
GFR, Estimated: >60 mL/min (ref >60)
Glucose, Bld: 107 mg/dL — ABNORMAL HIGH (ref 70–99)
Potassium: 5.1 mmol/L (ref 3.5–5.1)
Sodium: 140 mmol/L (ref 135–145)
Total Bilirubin: 0.7 mg/dL (ref 0.3–1.2)
Total Protein: 7.5 g/dL (ref 6.5–8.1)

## 2020-08-11 LAB — CBC WITH DIFFERENTIAL/PLATELET
Abs Immature Granulocytes: 0.01 10*3/uL (ref 0.00–0.07)
Basophils Absolute: 0 10*3/uL (ref 0.0–0.1)
Basophils Relative: 1 %
Eosinophils Absolute: 0.2 10*3/uL (ref 0.0–0.5)
Eosinophils Relative: 3 %
HCT: 40.3 % (ref 36.0–46.0)
Hemoglobin: 13.3 g/dL (ref 12.0–15.0)
Immature Granulocytes: 0 %
Lymphocytes Relative: 30 %
Lymphs Abs: 1.5 10*3/uL (ref 0.7–4.0)
MCH: 31.2 pg (ref 26.0–34.0)
MCHC: 33 g/dL (ref 30.0–36.0)
MCV: 94.6 fL (ref 80.0–100.0)
Monocytes Absolute: 0.3 10*3/uL (ref 0.1–1.0)
Monocytes Relative: 7 %
Neutro Abs: 2.9 10*3/uL (ref 1.7–7.7)
Neutrophils Relative %: 59 %
Platelets: 168 10*3/uL (ref 150–400)
RBC: 4.26 MIL/uL (ref 3.87–5.11)
RDW: 13.1 % (ref 11.5–15.5)
WBC: 4.9 10*3/uL (ref 4.0–10.5)
nRBC: 0 % (ref 0.0–0.2)

## 2020-08-11 NOTE — Progress Notes (Signed)
  Oncology Nurse Navigator Documentation  Navigator Location: [facility] (08/11/20 1300)   )Navigator Encounter Type: Appt/Treatment Plan Review (08/11/20 1300)                         Barriers/Navigation Needs: No Barriers At This Time;No Needs (08/11/20 1300)   Interventions: None Required (08/11/20 1300)

## 2020-08-11 NOTE — Progress Notes (Signed)
Patient here for follow up. Pt reports diarrhea since yesterday and Pain to left axillary lymph nodes.
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
## 2020-08-16 ENCOUNTER — Other Ambulatory Visit (HOSPITAL_BASED_OUTPATIENT_CLINIC_OR_DEPARTMENT_OTHER): Payer: Self-pay

## 2020-08-24 ENCOUNTER — Other Ambulatory Visit (HOSPITAL_COMMUNITY): Payer: Self-pay

## 2020-08-24 MED FILL — Osimertinib Mesylate Tab 80 MG (Base Equivalent): ORAL | 30 days supply | Qty: 30 | Fill #0 | Status: AC

## 2020-08-25 ENCOUNTER — Other Ambulatory Visit (HOSPITAL_COMMUNITY): Payer: Self-pay

## 2020-09-08 ENCOUNTER — Inpatient Hospital Stay: Payer: Medicare Other | Attending: Oncology

## 2020-09-08 ENCOUNTER — Encounter: Payer: Self-pay | Admitting: Oncology

## 2020-09-08 ENCOUNTER — Inpatient Hospital Stay (HOSPITAL_BASED_OUTPATIENT_CLINIC_OR_DEPARTMENT_OTHER): Payer: Medicare Other | Admitting: Oncology

## 2020-09-08 VITALS — BP 135/65 | HR 70 | Temp 96.8°F | Resp 18 | Wt 146.9 lb

## 2020-09-08 DIAGNOSIS — R062 Wheezing: Secondary | ICD-10-CM | POA: Diagnosis not present

## 2020-09-08 DIAGNOSIS — Z8261 Family history of arthritis: Secondary | ICD-10-CM | POA: Diagnosis not present

## 2020-09-08 DIAGNOSIS — R059 Cough, unspecified: Secondary | ICD-10-CM | POA: Insufficient documentation

## 2020-09-08 DIAGNOSIS — Z87891 Personal history of nicotine dependence: Secondary | ICD-10-CM | POA: Diagnosis not present

## 2020-09-08 DIAGNOSIS — Z5111 Encounter for antineoplastic chemotherapy: Secondary | ICD-10-CM | POA: Diagnosis not present

## 2020-09-08 DIAGNOSIS — E119 Type 2 diabetes mellitus without complications: Secondary | ICD-10-CM | POA: Diagnosis not present

## 2020-09-08 DIAGNOSIS — C7951 Secondary malignant neoplasm of bone: Secondary | ICD-10-CM

## 2020-09-08 DIAGNOSIS — I700 Atherosclerosis of aorta: Secondary | ICD-10-CM | POA: Insufficient documentation

## 2020-09-08 DIAGNOSIS — R590 Localized enlarged lymph nodes: Secondary | ICD-10-CM | POA: Insufficient documentation

## 2020-09-08 DIAGNOSIS — E042 Nontoxic multinodular goiter: Secondary | ICD-10-CM | POA: Diagnosis not present

## 2020-09-08 DIAGNOSIS — K760 Fatty (change of) liver, not elsewhere classified: Secondary | ICD-10-CM | POA: Insufficient documentation

## 2020-09-08 DIAGNOSIS — R197 Diarrhea, unspecified: Secondary | ICD-10-CM | POA: Insufficient documentation

## 2020-09-08 DIAGNOSIS — I517 Cardiomegaly: Secondary | ICD-10-CM | POA: Diagnosis not present

## 2020-09-08 DIAGNOSIS — Z800 Family history of malignant neoplasm of digestive organs: Secondary | ICD-10-CM | POA: Insufficient documentation

## 2020-09-08 DIAGNOSIS — Z833 Family history of diabetes mellitus: Secondary | ICD-10-CM | POA: Insufficient documentation

## 2020-09-08 DIAGNOSIS — Z79899 Other long term (current) drug therapy: Secondary | ICD-10-CM | POA: Insufficient documentation

## 2020-09-08 DIAGNOSIS — K429 Umbilical hernia without obstruction or gangrene: Secondary | ICD-10-CM | POA: Diagnosis not present

## 2020-09-08 DIAGNOSIS — C3431 Malignant neoplasm of lower lobe, right bronchus or lung: Secondary | ICD-10-CM | POA: Insufficient documentation

## 2020-09-08 DIAGNOSIS — C3490 Malignant neoplasm of unspecified part of unspecified bronchus or lung: Secondary | ICD-10-CM | POA: Diagnosis not present

## 2020-09-08 DIAGNOSIS — J432 Centrilobular emphysema: Secondary | ICD-10-CM | POA: Diagnosis not present

## 2020-09-08 DIAGNOSIS — J90 Pleural effusion, not elsewhere classified: Secondary | ICD-10-CM | POA: Diagnosis not present

## 2020-09-08 DIAGNOSIS — C3491 Malignant neoplasm of unspecified part of right bronchus or lung: Secondary | ICD-10-CM

## 2020-09-08 DIAGNOSIS — I2510 Atherosclerotic heart disease of native coronary artery without angina pectoris: Secondary | ICD-10-CM | POA: Insufficient documentation

## 2020-09-08 LAB — CBC WITH DIFFERENTIAL/PLATELET
Abs Immature Granulocytes: 0 10*3/uL (ref 0.00–0.07)
Basophils Absolute: 0 10*3/uL (ref 0.0–0.1)
Basophils Relative: 0 %
Eosinophils Absolute: 0.2 10*3/uL (ref 0.0–0.5)
Eosinophils Relative: 3 %
HCT: 38.3 % (ref 36.0–46.0)
Hemoglobin: 13 g/dL (ref 12.0–15.0)
Immature Granulocytes: 0 %
Lymphocytes Relative: 31 %
Lymphs Abs: 1.5 10*3/uL (ref 0.7–4.0)
MCH: 31.7 pg (ref 26.0–34.0)
MCHC: 33.9 g/dL (ref 30.0–36.0)
MCV: 93.4 fL (ref 80.0–100.0)
Monocytes Absolute: 0.5 10*3/uL (ref 0.1–1.0)
Monocytes Relative: 10 %
Neutro Abs: 2.7 10*3/uL (ref 1.7–7.7)
Neutrophils Relative %: 56 %
Platelets: 159 10*3/uL (ref 150–400)
RBC: 4.1 MIL/uL (ref 3.87–5.11)
RDW: 13.3 % (ref 11.5–15.5)
WBC: 4.9 10*3/uL (ref 4.0–10.5)
nRBC: 0 % (ref 0.0–0.2)

## 2020-09-08 LAB — COMPREHENSIVE METABOLIC PANEL
ALT: 13 U/L (ref 0–44)
AST: 15 U/L (ref 15–41)
Albumin: 4.4 g/dL (ref 3.5–5.0)
Alkaline Phosphatase: 43 U/L (ref 38–126)
Anion gap: 9 (ref 5–15)
BUN: 22 mg/dL (ref 8–23)
CO2: 26 mmol/L (ref 22–32)
Calcium: 9.5 mg/dL (ref 8.9–10.3)
Chloride: 105 mmol/L (ref 98–111)
Creatinine, Ser: 0.9 mg/dL (ref 0.44–1.00)
GFR, Estimated: >60 mL/min (ref >60)
Glucose, Bld: 147 mg/dL — ABNORMAL HIGH (ref 70–99)
Potassium: 5 mmol/L (ref 3.5–5.1)
Sodium: 140 mmol/L (ref 135–145)
Total Bilirubin: 0.7 mg/dL (ref 0.3–1.2)
Total Protein: 7.5 g/dL (ref 6.5–8.1)

## 2020-09-08 MED ORDER — ALBUTEROL SULFATE HFA 108 (90 BASE) MCG/ACT IN AERS
2.0000 | INHALATION_SPRAY | Freq: Four times a day (QID) | RESPIRATORY_TRACT | 1 refills | Status: DC | PRN
Start: 2020-09-08 — End: 2021-01-19
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
## 2020-09-08 NOTE — Progress Notes (Signed)
Patient here for follow up. Pt reports she has occasional diarrhea and has tried immodium, which is effective.

## 2020-09-19 ENCOUNTER — Other Ambulatory Visit (HOSPITAL_COMMUNITY): Payer: Self-pay

## 2020-09-19 MED FILL — Osimertinib Mesylate Tab 80 MG (Base Equivalent): ORAL | 30 days supply | Qty: 30 | Fill #1 | Status: AC

## 2020-09-20 ENCOUNTER — Other Ambulatory Visit (HOSPITAL_COMMUNITY): Payer: Self-pay

## 2020-09-25 ENCOUNTER — Other Ambulatory Visit: Payer: Self-pay

## 2020-09-25 ENCOUNTER — Encounter: Payer: Self-pay | Admitting: Emergency Medicine

## 2020-09-25 ENCOUNTER — Emergency Department
Admission: EM | Admit: 2020-09-25 | Discharge: 2020-09-25 | Disposition: A | Discharge status: Home / Self Care | Payer: Medicare Other | Attending: Emergency Medicine | Admitting: Emergency Medicine

## 2020-09-25 DIAGNOSIS — Z85830 Personal history of malignant neoplasm of bone: Secondary | ICD-10-CM | POA: Insufficient documentation

## 2020-09-25 DIAGNOSIS — Z7984 Long term (current) use of oral hypoglycemic drugs: Secondary | ICD-10-CM | POA: Insufficient documentation

## 2020-09-25 DIAGNOSIS — Z203 Contact with and (suspected) exposure to rabies: Secondary | ICD-10-CM | POA: Diagnosis not present

## 2020-09-25 DIAGNOSIS — E119 Type 2 diabetes mellitus without complications: Secondary | ICD-10-CM | POA: Diagnosis not present

## 2020-09-25 DIAGNOSIS — S81851A Open bite, right lower leg, initial encounter: Secondary | ICD-10-CM | POA: Diagnosis present

## 2020-09-25 DIAGNOSIS — Z23 Encounter for immunization: Secondary | ICD-10-CM | POA: Diagnosis not present

## 2020-09-25 DIAGNOSIS — S81831A Puncture wound without foreign body, right lower leg, initial encounter: Secondary | ICD-10-CM | POA: Insufficient documentation

## 2020-09-25 DIAGNOSIS — Z87891 Personal history of nicotine dependence: Secondary | ICD-10-CM | POA: Insufficient documentation

## 2020-09-25 DIAGNOSIS — Y9301 Activity, walking, marching and hiking: Secondary | ICD-10-CM | POA: Diagnosis not present

## 2020-09-25 DIAGNOSIS — Z2914 Encounter for prophylactic rabies immune globin: Secondary | ICD-10-CM | POA: Insufficient documentation

## 2020-09-25 DIAGNOSIS — W5551XA Bitten by raccoon, initial encounter: Secondary | ICD-10-CM | POA: Diagnosis not present

## 2020-09-25 DIAGNOSIS — T148XXA Other injury of unspecified body region, initial encounter: Secondary | ICD-10-CM

## 2020-09-25 MED ORDER — TETANUS-DIPHTH-ACELL PERTUSSIS 5-2.5-18.5 LF-MCG/0.5 IM SUSY
0.5000 mL | PREFILLED_SYRINGE | Freq: Once | INTRAMUSCULAR | Status: AC
  Administered 2020-09-25: 0.5 mL via INTRAMUSCULAR
  Filled 2020-09-25: qty 0.5

## 2020-09-25 MED ORDER — AMOXICILLIN-POT CLAVULANATE 875-125 MG PO TABS: 1.0000 | ORAL_TABLET | Freq: Two times a day (BID) | ORAL | 0 refills | Status: AC

## 2020-09-25 MED ORDER — RABIES VACCINE, PCEC IM SUSR
1.0000 mL | Freq: Once | INTRAMUSCULAR | Status: AC
  Administered 2020-09-25: 1 mL via INTRAMUSCULAR
  Filled 2020-09-25: qty 1

## 2020-09-25 MED ORDER — RABIES IMMUNE GLOBULIN 150 UNIT/ML IM INJ
20.0000 [IU]/kg | INJECTION | Freq: Once | INTRAMUSCULAR | Status: AC
  Administered 2020-09-25: 1350 [IU] via INTRAMUSCULAR
  Filled 2020-09-25: qty 9

## 2020-09-25 NOTE — ED Triage Notes (Signed)
Pt reports was getting into the car this am and stepped on a racoon who then bit her leg. Pt reports is a cancer patient as well/

## 2020-09-25 NOTE — ED Notes (Signed)
Animal control called at this time and given information.

## 2020-09-25 NOTE — Discharge Instructions (Addendum)
Begin taking antibiotics as directed to prevent infection at the site of your bite.  Continue clean daily with mild soap and water.  If any signs of infection return to the emergency department sooner.  You will need to return for remaining rabies vaccine on the following days.  5/11 5/15 5/22
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
## 2020-09-28 ENCOUNTER — Other Ambulatory Visit: Payer: Self-pay

## 2020-09-28 ENCOUNTER — Emergency Department
Admission: EM | Admit: 2020-09-28 | Discharge: 2020-09-28 | Disposition: A | Discharge status: Home / Self Care | Payer: Medicare Other | Attending: Emergency Medicine | Admitting: Emergency Medicine

## 2020-09-28 ENCOUNTER — Encounter: Payer: Self-pay | Admitting: Emergency Medicine

## 2020-09-28 DIAGNOSIS — E119 Type 2 diabetes mellitus without complications: Secondary | ICD-10-CM | POA: Insufficient documentation

## 2020-09-28 DIAGNOSIS — Z7984 Long term (current) use of oral hypoglycemic drugs: Secondary | ICD-10-CM | POA: Insufficient documentation

## 2020-09-28 DIAGNOSIS — Z87891 Personal history of nicotine dependence: Secondary | ICD-10-CM | POA: Insufficient documentation

## 2020-09-28 DIAGNOSIS — Z2914 Encounter for prophylactic rabies immune globin: Secondary | ICD-10-CM | POA: Diagnosis not present

## 2020-09-28 DIAGNOSIS — Z23 Encounter for immunization: Secondary | ICD-10-CM | POA: Insufficient documentation

## 2020-09-28 DIAGNOSIS — Z203 Contact with and (suspected) exposure to rabies: Secondary | ICD-10-CM | POA: Diagnosis not present

## 2020-09-28 DIAGNOSIS — Z79899 Other long term (current) drug therapy: Secondary | ICD-10-CM | POA: Diagnosis not present

## 2020-09-28 MED ORDER — RABIES VACCINE, PCEC IM SUSR
1.0000 mL | Freq: Once | INTRAMUSCULAR | Status: AC
  Administered 2020-09-28: 1 mL via INTRAMUSCULAR
  Filled 2020-09-28: qty 1

## 2020-09-28 NOTE — ED Notes (Signed)
See triage note  Presents for 2nd rabies shot   No other complaints

## 2020-09-28 NOTE — Discharge Instructions (Addendum)
Follow-up on May 15 for third shot.
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
## 2020-09-28 NOTE — ED Triage Notes (Signed)
Arrives for second rabies injection

## 2020-10-02 ENCOUNTER — Emergency Department
Admission: EM | Admit: 2020-10-02 | Discharge: 2020-10-02 | Disposition: A | Discharge status: Home / Self Care | Payer: Medicare Other | Attending: Emergency Medicine | Admitting: Emergency Medicine

## 2020-10-02 ENCOUNTER — Other Ambulatory Visit: Payer: Self-pay

## 2020-10-02 DIAGNOSIS — Z85118 Personal history of other malignant neoplasm of bronchus and lung: Secondary | ICD-10-CM | POA: Insufficient documentation

## 2020-10-02 DIAGNOSIS — Z203 Contact with and (suspected) exposure to rabies: Secondary | ICD-10-CM | POA: Insufficient documentation

## 2020-10-02 DIAGNOSIS — Z87891 Personal history of nicotine dependence: Secondary | ICD-10-CM | POA: Insufficient documentation

## 2020-10-02 DIAGNOSIS — Z23 Encounter for immunization: Secondary | ICD-10-CM | POA: Insufficient documentation

## 2020-10-02 DIAGNOSIS — E119 Type 2 diabetes mellitus without complications: Secondary | ICD-10-CM | POA: Diagnosis not present

## 2020-10-02 DIAGNOSIS — Z2914 Encounter for prophylactic rabies immune globin: Secondary | ICD-10-CM | POA: Insufficient documentation

## 2020-10-02 DIAGNOSIS — Z7984 Long term (current) use of oral hypoglycemic drugs: Secondary | ICD-10-CM | POA: Diagnosis not present

## 2020-10-02 MED ORDER — RABIES VACCINE, PCEC IM SUSR
1.0000 mL | Freq: Once | INTRAMUSCULAR | Status: AC
  Administered 2020-10-02: 1 mL via INTRAMUSCULAR
  Filled 2020-10-02: qty 1

## 2020-10-02 NOTE — ED Notes (Signed)
See triage note  Presents for rabies shot  No other complaints
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
## 2020-10-02 NOTE — ED Triage Notes (Signed)
Pt to ED POV for 3rd rabies injection. nad noted, no other complaints

## 2020-10-09 ENCOUNTER — Encounter: Payer: Self-pay | Admitting: Emergency Medicine

## 2020-10-09 ENCOUNTER — Other Ambulatory Visit: Payer: Self-pay

## 2020-10-09 ENCOUNTER — Emergency Department
Admission: EM | Admit: 2020-10-09 | Discharge: 2020-10-09 | Disposition: A | Discharge status: Home / Self Care | Payer: Medicare Other | Attending: Emergency Medicine | Admitting: Emergency Medicine

## 2020-10-09 DIAGNOSIS — Z203 Contact with and (suspected) exposure to rabies: Secondary | ICD-10-CM | POA: Insufficient documentation

## 2020-10-09 DIAGNOSIS — Z87891 Personal history of nicotine dependence: Secondary | ICD-10-CM | POA: Diagnosis not present

## 2020-10-09 DIAGNOSIS — Z85118 Personal history of other malignant neoplasm of bronchus and lung: Secondary | ICD-10-CM | POA: Diagnosis not present

## 2020-10-09 DIAGNOSIS — Z23 Encounter for immunization: Secondary | ICD-10-CM | POA: Diagnosis not present

## 2020-10-09 DIAGNOSIS — Z2914 Encounter for prophylactic rabies immune globin: Secondary | ICD-10-CM | POA: Insufficient documentation

## 2020-10-09 DIAGNOSIS — Z85830 Personal history of malignant neoplasm of bone: Secondary | ICD-10-CM | POA: Diagnosis not present

## 2020-10-09 DIAGNOSIS — Z7984 Long term (current) use of oral hypoglycemic drugs: Secondary | ICD-10-CM | POA: Diagnosis not present

## 2020-10-09 DIAGNOSIS — E119 Type 2 diabetes mellitus without complications: Secondary | ICD-10-CM | POA: Insufficient documentation

## 2020-10-09 MED ORDER — RABIES VACCINE, PCEC IM SUSR
1.0000 mL | Freq: Once | INTRAMUSCULAR | Status: AC
  Administered 2020-10-09: 1 mL via INTRAMUSCULAR
  Filled 2020-10-09: qty 1
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
## 2020-10-09 NOTE — ED Notes (Signed)
Pt has been provided with discharge instructions. Pt denies any questions or concerns at this time. Pt verbalizes understanding for follow up care and d/c.  VSS.  Pt left department with all belongings.  

## 2020-10-09 NOTE — ED Triage Notes (Signed)
Pt here for last rabies injection

## 2020-10-09 NOTE — Discharge Instructions (Signed)
This should be your final rabies vaccination.  If you develop any concerning reactions, please return to the emergency department, otherwise follow-up with primary care as needed.

## 2020-10-13 ENCOUNTER — Ambulatory Visit
Admission: RE | Admit: 2020-10-13 | Discharge: 2020-10-13 | Disposition: A | Discharge status: Home / Self Care | Payer: Medicare Other | Source: Ambulatory Visit | Attending: Oncology | Admitting: Oncology

## 2020-10-13 ENCOUNTER — Other Ambulatory Visit: Payer: Self-pay

## 2020-10-13 DIAGNOSIS — C3491 Malignant neoplasm of unspecified part of right bronchus or lung: Secondary | ICD-10-CM | POA: Insufficient documentation

## 2020-10-13 MED ORDER — IOHEXOL 300 MG/ML  SOLN
100.0000 mL | Freq: Once | INTRAMUSCULAR | Status: AC | PRN
  Administered 2020-10-13: 100 mL via INTRAVENOUS

## 2020-10-14 ENCOUNTER — Other Ambulatory Visit (HOSPITAL_COMMUNITY): Payer: Self-pay

## 2020-10-14 MED FILL — Osimertinib Mesylate Tab 80 MG (Base Equivalent): ORAL | 30 days supply | Qty: 30 | Fill #2 | Status: AC

## 2020-10-18 ENCOUNTER — Other Ambulatory Visit (HOSPITAL_COMMUNITY): Payer: Self-pay

## 2020-10-18 ENCOUNTER — Encounter: Payer: Self-pay | Admitting: Oncology

## 2020-10-18 ENCOUNTER — Inpatient Hospital Stay: Payer: Medicare Other | Attending: Oncology

## 2020-10-18 ENCOUNTER — Inpatient Hospital Stay (HOSPITAL_BASED_OUTPATIENT_CLINIC_OR_DEPARTMENT_OTHER): Payer: Medicare Other | Admitting: Oncology

## 2020-10-18 VITALS — BP 144/57 | HR 79 | Temp 98.5°F | Resp 18 | Wt 146.5 lb

## 2020-10-18 DIAGNOSIS — Z800 Family history of malignant neoplasm of digestive organs: Secondary | ICD-10-CM | POA: Insufficient documentation

## 2020-10-18 DIAGNOSIS — J90 Pleural effusion, not elsewhere classified: Secondary | ICD-10-CM | POA: Diagnosis not present

## 2020-10-18 DIAGNOSIS — Z5111 Encounter for antineoplastic chemotherapy: Secondary | ICD-10-CM

## 2020-10-18 DIAGNOSIS — E041 Nontoxic single thyroid nodule: Secondary | ICD-10-CM | POA: Diagnosis not present

## 2020-10-18 DIAGNOSIS — E119 Type 2 diabetes mellitus without complications: Secondary | ICD-10-CM | POA: Diagnosis not present

## 2020-10-18 DIAGNOSIS — Z833 Family history of diabetes mellitus: Secondary | ICD-10-CM | POA: Insufficient documentation

## 2020-10-18 DIAGNOSIS — C7951 Secondary malignant neoplasm of bone: Secondary | ICD-10-CM

## 2020-10-18 DIAGNOSIS — Z79899 Other long term (current) drug therapy: Secondary | ICD-10-CM | POA: Insufficient documentation

## 2020-10-18 DIAGNOSIS — M47816 Spondylosis without myelopathy or radiculopathy, lumbar region: Secondary | ICD-10-CM | POA: Insufficient documentation

## 2020-10-18 DIAGNOSIS — R059 Cough, unspecified: Secondary | ICD-10-CM | POA: Insufficient documentation

## 2020-10-18 DIAGNOSIS — K5730 Diverticulosis of large intestine without perforation or abscess without bleeding: Secondary | ICD-10-CM | POA: Insufficient documentation

## 2020-10-18 DIAGNOSIS — Z8261 Family history of arthritis: Secondary | ICD-10-CM | POA: Diagnosis not present

## 2020-10-18 DIAGNOSIS — M47814 Spondylosis without myelopathy or radiculopathy, thoracic region: Secondary | ICD-10-CM | POA: Insufficient documentation

## 2020-10-18 DIAGNOSIS — R0602 Shortness of breath: Secondary | ICD-10-CM | POA: Diagnosis not present

## 2020-10-18 DIAGNOSIS — C3411 Malignant neoplasm of upper lobe, right bronchus or lung: Secondary | ICD-10-CM | POA: Insufficient documentation

## 2020-10-18 DIAGNOSIS — C3491 Malignant neoplasm of unspecified part of right bronchus or lung: Secondary | ICD-10-CM

## 2020-10-18 DIAGNOSIS — Z87891 Personal history of nicotine dependence: Secondary | ICD-10-CM | POA: Insufficient documentation

## 2020-10-18 DIAGNOSIS — K7460 Unspecified cirrhosis of liver: Secondary | ICD-10-CM | POA: Insufficient documentation

## 2020-10-18 DIAGNOSIS — I700 Atherosclerosis of aorta: Secondary | ICD-10-CM | POA: Diagnosis not present

## 2020-10-18 DIAGNOSIS — C3490 Malignant neoplasm of unspecified part of unspecified bronchus or lung: Secondary | ICD-10-CM | POA: Diagnosis not present

## 2020-10-18 LAB — CBC WITH DIFFERENTIAL/PLATELET
Abs Immature Granulocytes: 0.01 10*3/uL (ref 0.00–0.07)
Basophils Absolute: 0 10*3/uL (ref 0.0–0.1)
Basophils Relative: 1 %
Eosinophils Absolute: 0.2 10*3/uL (ref 0.0–0.5)
Eosinophils Relative: 4 %
HCT: 34.8 % — ABNORMAL LOW (ref 36.0–46.0)
Hemoglobin: 11.9 g/dL — ABNORMAL LOW (ref 12.0–15.0)
Immature Granulocytes: 0 %
Lymphocytes Relative: 32 %
Lymphs Abs: 1.4 10*3/uL (ref 0.7–4.0)
MCH: 32.5 pg (ref 26.0–34.0)
MCHC: 34.2 g/dL (ref 30.0–36.0)
MCV: 95.1 fL (ref 80.0–100.0)
Monocytes Absolute: 0.5 10*3/uL (ref 0.1–1.0)
Monocytes Relative: 10 %
Neutro Abs: 2.4 10*3/uL (ref 1.7–7.7)
Neutrophils Relative %: 53 %
Platelets: 181 10*3/uL (ref 150–400)
RBC: 3.66 MIL/uL — ABNORMAL LOW (ref 3.87–5.11)
RDW: 13.5 % (ref 11.5–15.5)
WBC: 4.5 10*3/uL (ref 4.0–10.5)
nRBC: 0 % (ref 0.0–0.2)

## 2020-10-18 LAB — COMPREHENSIVE METABOLIC PANEL
ALT: 13 U/L (ref 0–44)
AST: 16 U/L (ref 15–41)
Albumin: 4.4 g/dL (ref 3.5–5.0)
Alkaline Phosphatase: 41 U/L (ref 38–126)
Anion gap: 9 (ref 5–15)
BUN: 19 mg/dL (ref 8–23)
CO2: 28 mmol/L (ref 22–32)
Calcium: 9.4 mg/dL (ref 8.9–10.3)
Chloride: 100 mmol/L (ref 98–111)
Creatinine, Ser: 0.99 mg/dL (ref 0.44–1.00)
GFR, Estimated: >60 mL/min (ref >60)
Glucose, Bld: 116 mg/dL — ABNORMAL HIGH (ref 70–99)
Potassium: 4.4 mmol/L (ref 3.5–5.1)
Sodium: 137 mmol/L (ref 135–145)
Total Bilirubin: 0.7 mg/dL (ref 0.3–1.2)
Total Protein: 7.2 g/dL (ref 6.5–8.1)

## 2020-10-18 MED ORDER — CALCIUM CARBONATE 1250 (500 CA) MG PO TABS: 1.0000 | ORAL_TABLET | Freq: Every day | ORAL | 3 refills | Status: AC

## 2020-10-18 MED ORDER — OSIMERTINIB MESYLATE 80 MG PO TABS: 80.0000 mg | ORAL_TABLET | Freq: Every day | ORAL | 1 refills | Status: DC
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
## 2020-10-18 NOTE — Progress Notes (Signed)
Pt here for follow up. Reports having leg cramps that wake her up at night.

## 2020-10-20 ENCOUNTER — Telehealth: Payer: Self-pay | Admitting: Oncology

## 2020-10-20 NOTE — Telephone Encounter (Signed)
Spoke with patient to notify her of scheduled US of the thyroid. Also scheduled Zometa infusion per MD inbasket message--patient would like to come in on Tuesday 6/7.

## 2020-10-25 ENCOUNTER — Other Ambulatory Visit: Payer: Self-pay

## 2020-10-25 ENCOUNTER — Inpatient Hospital Stay: Payer: Medicare Other | Attending: Oncology

## 2020-10-25 VITALS — BP 111/47 | HR 76 | Temp 96.8°F | Resp 18

## 2020-10-25 DIAGNOSIS — E119 Type 2 diabetes mellitus without complications: Secondary | ICD-10-CM | POA: Insufficient documentation

## 2020-10-25 DIAGNOSIS — E041 Nontoxic single thyroid nodule: Secondary | ICD-10-CM | POA: Diagnosis not present

## 2020-10-25 DIAGNOSIS — M47814 Spondylosis without myelopathy or radiculopathy, thoracic region: Secondary | ICD-10-CM | POA: Diagnosis not present

## 2020-10-25 DIAGNOSIS — C3491 Malignant neoplasm of unspecified part of right bronchus or lung: Secondary | ICD-10-CM

## 2020-10-25 DIAGNOSIS — R531 Weakness: Secondary | ICD-10-CM | POA: Diagnosis not present

## 2020-10-25 DIAGNOSIS — E079 Disorder of thyroid, unspecified: Secondary | ICD-10-CM | POA: Diagnosis not present

## 2020-10-25 DIAGNOSIS — Z515 Encounter for palliative care: Secondary | ICD-10-CM | POA: Insufficient documentation

## 2020-10-25 DIAGNOSIS — M2540 Effusion, unspecified joint: Secondary | ICD-10-CM | POA: Insufficient documentation

## 2020-10-25 DIAGNOSIS — C7951 Secondary malignant neoplasm of bone: Secondary | ICD-10-CM | POA: Insufficient documentation

## 2020-10-25 DIAGNOSIS — N390 Urinary tract infection, site not specified: Secondary | ICD-10-CM | POA: Insufficient documentation

## 2020-10-25 DIAGNOSIS — Z79899 Other long term (current) drug therapy: Secondary | ICD-10-CM | POA: Insufficient documentation

## 2020-10-25 DIAGNOSIS — Z87440 Personal history of urinary (tract) infections: Secondary | ICD-10-CM | POA: Diagnosis not present

## 2020-10-25 DIAGNOSIS — I700 Atherosclerosis of aorta: Secondary | ICD-10-CM | POA: Diagnosis not present

## 2020-10-25 DIAGNOSIS — C3431 Malignant neoplasm of lower lobe, right bronchus or lung: Secondary | ICD-10-CM | POA: Insufficient documentation

## 2020-10-25 DIAGNOSIS — M47816 Spondylosis without myelopathy or radiculopathy, lumbar region: Secondary | ICD-10-CM | POA: Diagnosis not present

## 2020-10-25 DIAGNOSIS — K5730 Diverticulosis of large intestine without perforation or abscess without bleeding: Secondary | ICD-10-CM | POA: Insufficient documentation

## 2020-10-25 DIAGNOSIS — R112 Nausea with vomiting, unspecified: Secondary | ICD-10-CM | POA: Insufficient documentation

## 2020-10-25 MED ORDER — ZOLEDRONIC ACID 4 MG/5ML IV CONC
3.5000 mg | Freq: Once | INTRAVENOUS | Status: AC
  Administered 2020-10-25: 3.5 mg via INTRAVENOUS
  Filled 2020-10-25: qty 4.38

## 2020-10-25 MED ORDER — SODIUM CHLORIDE 0.9 % IV SOLN
Freq: Once | INTRAVENOUS | Status: AC
  Filled 2020-10-25: qty 250

## 2020-10-27 ENCOUNTER — Other Ambulatory Visit: Payer: Self-pay

## 2020-10-27 ENCOUNTER — Telehealth: Payer: Self-pay

## 2020-10-27 ENCOUNTER — Inpatient Hospital Stay (HOSPITAL_BASED_OUTPATIENT_CLINIC_OR_DEPARTMENT_OTHER): Payer: Medicare Other | Admitting: Hospice and Palliative Medicine

## 2020-10-27 ENCOUNTER — Inpatient Hospital Stay: Payer: Medicare Other

## 2020-10-27 ENCOUNTER — Encounter: Payer: Self-pay | Admitting: Hospice and Palliative Medicine

## 2020-10-27 ENCOUNTER — Telehealth: Payer: Self-pay | Admitting: *Deleted

## 2020-10-27 VITALS — BP 110/56 | HR 76 | Temp 100.1°F | Resp 16 | Wt 141.4 lb

## 2020-10-27 DIAGNOSIS — N390 Urinary tract infection, site not specified: Secondary | ICD-10-CM | POA: Diagnosis not present

## 2020-10-27 DIAGNOSIS — C3431 Malignant neoplasm of lower lobe, right bronchus or lung: Secondary | ICD-10-CM | POA: Diagnosis not present

## 2020-10-27 DIAGNOSIS — C3491 Malignant neoplasm of unspecified part of right bronchus or lung: Secondary | ICD-10-CM

## 2020-10-27 DIAGNOSIS — R110 Nausea: Secondary | ICD-10-CM

## 2020-10-27 DIAGNOSIS — C7951 Secondary malignant neoplasm of bone: Secondary | ICD-10-CM | POA: Diagnosis not present

## 2020-10-27 LAB — CBC WITH DIFFERENTIAL/PLATELET
Abs Immature Granulocytes: 0.01 10*3/uL (ref 0.00–0.07)
Basophils Absolute: 0 10*3/uL (ref 0.0–0.1)
Basophils Relative: 0 %
Eosinophils Absolute: 0.1 10*3/uL (ref 0.0–0.5)
Eosinophils Relative: 1 %
HCT: 35.6 % — ABNORMAL LOW (ref 36.0–46.0)
Hemoglobin: 12 g/dL (ref 12.0–15.0)
Immature Granulocytes: 0 %
Lymphocytes Relative: 21 %
Lymphs Abs: 1 10*3/uL (ref 0.7–4.0)
MCH: 32.5 pg (ref 26.0–34.0)
MCHC: 33.7 g/dL (ref 30.0–36.0)
MCV: 96.5 fL (ref 80.0–100.0)
Monocytes Absolute: 0.3 10*3/uL (ref 0.1–1.0)
Monocytes Relative: 6 %
Neutro Abs: 3.5 10*3/uL (ref 1.7–7.7)
Neutrophils Relative %: 72 %
Platelets: 164 10*3/uL (ref 150–400)
RBC: 3.69 MIL/uL — ABNORMAL LOW (ref 3.87–5.11)
RDW: 13.6 % (ref 11.5–15.5)
WBC: 4.9 10*3/uL (ref 4.0–10.5)
nRBC: 0 % (ref 0.0–0.2)

## 2020-10-27 LAB — COMPREHENSIVE METABOLIC PANEL
ALT: 15 U/L (ref 0–44)
AST: 20 U/L (ref 15–41)
Albumin: 4.2 g/dL (ref 3.5–5.0)
Alkaline Phosphatase: 39 U/L (ref 38–126)
Anion gap: 11 (ref 5–15)
BUN: 18 mg/dL (ref 8–23)
CO2: 26 mmol/L (ref 22–32)
Calcium: 9.2 mg/dL (ref 8.9–10.3)
Chloride: 98 mmol/L (ref 98–111)
Creatinine, Ser: 0.87 mg/dL (ref 0.44–1.00)
GFR, Estimated: >60 mL/min (ref >60)
Glucose, Bld: 134 mg/dL — ABNORMAL HIGH (ref 70–99)
Potassium: 4.8 mmol/L (ref 3.5–5.1)
Sodium: 135 mmol/L (ref 135–145)
Total Bilirubin: 0.7 mg/dL (ref 0.3–1.2)
Total Protein: 7.3 g/dL (ref 6.5–8.1)

## 2020-10-27 LAB — URINALYSIS, COMPLETE (UACMP) WITH MICROSCOPIC
Bilirubin Urine: NEGATIVE
Glucose, UA: NEGATIVE mg/dL
Hgb urine dipstick: NEGATIVE
Ketones, ur: NEGATIVE mg/dL
Nitrite: NEGATIVE
Protein, ur: NEGATIVE mg/dL
Specific Gravity, Urine: 1.003 — ABNORMAL LOW (ref 1.005–1.030)
Squamous Epithelial / HPF: NONE SEEN (ref 0–5)
pH: 6 (ref 5.0–8.0)

## 2020-10-27 MED ORDER — PROCHLORPERAZINE MALEATE 10 MG PO TABS: 10.0000 mg | ORAL_TABLET | Freq: Four times a day (QID) | ORAL | 0 refills | Status: DC | PRN

## 2020-10-27 MED ORDER — NITROFURANTOIN MONOHYD MACRO 100 MG PO CAPS: 100.0000 mg | ORAL_CAPSULE | Freq: Two times a day (BID) | ORAL | 0 refills | Status: DC

## 2020-10-27 MED ORDER — ONDANSETRON HCL 8 MG PO TABS: 8.0000 mg | ORAL_TABLET | Freq: Three times a day (TID) | ORAL | 0 refills | Status: DC | PRN

## 2020-10-27 NOTE — Telephone Encounter (Signed)
Phoned patient and she has reported abdominal pain, nausea, vomiting, dizziness, and also urinary symptoms such as dysuria and frequency. Denies fever. Pt agreeable to labs and [facility] visit this afternoon. Will be here at 3pm.
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
## 2020-10-27 NOTE — Telephone Encounter (Signed)
Call from answering service this morning that patient called reporting that she had an infusion yesterday and today she is having a headache, dizziness, and is vomiting. Please advise

## 2020-10-27 NOTE — Progress Notes (Signed)
Patient here for symptom management, she reports that two days after receiving Zometa she started to experience nausea and vomiting she vomited time 3 today, she also has loose stools times 3 she states that her stools have been white, she has decreased appetite and a H/A level 5 pain that did not respond tp Tylenol. She is also experiencing pain with urination for the past 24 hours.

## 2020-10-27 NOTE — Telephone Encounter (Signed)
Due to scheduling conflict with provider, pt was phoned and asked if she could come to clinic earlier, at 2pm. Pt agreeable to change in appointment times.

## 2020-10-29 LAB — URINE CULTURE

## 2020-11-02 ENCOUNTER — Inpatient Hospital Stay (HOSPITAL_BASED_OUTPATIENT_CLINIC_OR_DEPARTMENT_OTHER): Payer: Medicare Other | Admitting: Hospice and Palliative Medicine

## 2020-11-02 ENCOUNTER — Encounter: Payer: Self-pay | Admitting: Hospice and Palliative Medicine

## 2020-11-02 ENCOUNTER — Telehealth: Payer: Self-pay | Admitting: Oncology

## 2020-11-02 VITALS — BP 136/50 | HR 78 | Temp 98.4°F | Resp 18 | Wt 143.0 lb

## 2020-11-02 DIAGNOSIS — Z515 Encounter for palliative care: Secondary | ICD-10-CM

## 2020-11-02 DIAGNOSIS — C3491 Malignant neoplasm of unspecified part of right bronchus or lung: Secondary | ICD-10-CM

## 2020-11-02 DIAGNOSIS — C3431 Malignant neoplasm of lower lobe, right bronchus or lung: Secondary | ICD-10-CM | POA: Diagnosis not present

## 2020-11-02 NOTE — Telephone Encounter (Signed)
11/02/2020 Called pt's number and spoke with her friend (listed on DPR), and informed him of f/u appt on 12/05/20 @ 9:45 to see Dr. Tasia Catchings. Pt's friend confirmed appt and will pass along the message  SRW
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
## 2020-11-02 NOTE — Progress Notes (Signed)
Patient here for oncology follow-up appointment, expresses concerns of right thumb swelling, no taste, neck/ear pain, headaches

## 2020-11-03 ENCOUNTER — Other Ambulatory Visit: Payer: Self-pay

## 2020-11-03 ENCOUNTER — Ambulatory Visit
Admission: RE | Admit: 2020-11-03 | Discharge: 2020-11-03 | Disposition: A | Discharge status: Home / Self Care | Payer: Medicare Other | Source: Ambulatory Visit | Attending: Oncology | Admitting: Oncology

## 2020-11-03 DIAGNOSIS — C7951 Secondary malignant neoplasm of bone: Secondary | ICD-10-CM | POA: Diagnosis present

## 2020-11-03 DIAGNOSIS — C3491 Malignant neoplasm of unspecified part of right bronchus or lung: Secondary | ICD-10-CM | POA: Insufficient documentation

## 2020-11-03 DIAGNOSIS — E041 Nontoxic single thyroid nodule: Secondary | ICD-10-CM | POA: Diagnosis present

## 2020-11-03 DIAGNOSIS — Z5111 Encounter for antineoplastic chemotherapy: Secondary | ICD-10-CM | POA: Diagnosis present

## 2020-11-03 DIAGNOSIS — C3490 Malignant neoplasm of unspecified part of unspecified bronchus or lung: Secondary | ICD-10-CM | POA: Insufficient documentation

## 2020-11-14 ENCOUNTER — Other Ambulatory Visit (HOSPITAL_COMMUNITY): Payer: Self-pay

## 2020-11-14 ENCOUNTER — Other Ambulatory Visit: Payer: Self-pay

## 2020-11-14 ENCOUNTER — Telehealth: Payer: Self-pay

## 2020-11-14 DIAGNOSIS — E041 Nontoxic single thyroid nodule: Secondary | ICD-10-CM

## 2020-11-14 NOTE — Telephone Encounter (Signed)
-----   Message from Vanice Sarah, [place] sent at 11/14/2020  8:35 AM EDT -----  ----- Message ----- From: Earlie Server, MD Sent: 11/12/2020   9:45 PM EDT To: Evelina Dun, RN, Vanice Sarah, CMA  Recommend patient to have thyroid biopsy of two thyroid nodules. Please arrange if she agrees

## 2020-11-14 NOTE — Telephone Encounter (Signed)
[place] [facility] contacted pt and pt is agreeable to thyroid biopsy. Biopsy request faxed to specials scheduling.

## 2020-11-16 ENCOUNTER — Other Ambulatory Visit: Payer: Self-pay

## 2020-11-16 ENCOUNTER — Other Ambulatory Visit (HOSPITAL_COMMUNITY): Payer: Self-pay

## 2020-11-16 ENCOUNTER — Other Ambulatory Visit: Payer: Self-pay | Admitting: Oncology

## 2020-11-16 DIAGNOSIS — E041 Nontoxic single thyroid nodule: Secondary | ICD-10-CM

## 2020-11-17 ENCOUNTER — Encounter: Payer: Self-pay | Admitting: Oncology

## 2020-11-17 NOTE — Telephone Encounter (Signed)
Patient scheduled for Thyroid biopsy on Tues 11/22/20  @ 1p with arrival @ 12:30. Patient notified of appt and to keep appt with MD on 7/18 for biopsy resutls.

## 2020-11-20 ENCOUNTER — Other Ambulatory Visit: Payer: Self-pay | Admitting: Oncology

## 2020-11-20 ENCOUNTER — Encounter: Payer: Self-pay | Admitting: Oncology

## 2020-11-20 DIAGNOSIS — C3491 Malignant neoplasm of unspecified part of right bronchus or lung: Secondary | ICD-10-CM

## 2020-11-22 ENCOUNTER — Ambulatory Visit
Admission: RE | Admit: 2020-11-22 | Discharge: 2020-11-22 | Disposition: A | Discharge status: Home / Self Care | Payer: Medicare Other | Source: Ambulatory Visit | Attending: Oncology | Admitting: Oncology

## 2020-11-22 ENCOUNTER — Other Ambulatory Visit: Payer: Self-pay

## 2020-11-22 ENCOUNTER — Other Ambulatory Visit (HOSPITAL_COMMUNITY): Payer: Self-pay

## 2020-11-22 DIAGNOSIS — E041 Nontoxic single thyroid nodule: Secondary | ICD-10-CM | POA: Diagnosis not present

## 2020-11-25 LAB — CYTOLOGY - NON PAP

## 2020-11-28 ENCOUNTER — Other Ambulatory Visit: Payer: Self-pay | Admitting: Oncology

## 2020-11-28 ENCOUNTER — Other Ambulatory Visit (HOSPITAL_COMMUNITY): Payer: Self-pay

## 2020-11-28 DIAGNOSIS — C3491 Malignant neoplasm of unspecified part of right bronchus or lung: Secondary | ICD-10-CM

## 2020-11-28 MED ORDER — OSIMERTINIB MESYLATE 80 MG PO TABS
ORAL_TABLET | Freq: Every day | ORAL | 3 refills | Status: DC
Start: 2020-11-28 — End: 2021-03-20
  Filled 2020-11-28: qty 30, 30d supply, fill #0
  Filled 2020-12-26: qty 30, 30d supply, fill #1
  Filled 2021-01-18: qty 30, 30d supply, fill #2
  Filled 2021-02-16: qty 30, 30d supply, fill #3

## 2020-11-29 ENCOUNTER — Other Ambulatory Visit (HOSPITAL_COMMUNITY): Payer: Self-pay

## 2020-12-02 ENCOUNTER — Other Ambulatory Visit: Payer: Self-pay

## 2020-12-02 DIAGNOSIS — C3491 Malignant neoplasm of unspecified part of right bronchus or lung: Secondary | ICD-10-CM

## 2020-12-05 ENCOUNTER — Inpatient Hospital Stay: Payer: Medicare Other | Attending: Oncology

## 2020-12-05 ENCOUNTER — Other Ambulatory Visit: Payer: Self-pay

## 2020-12-05 ENCOUNTER — Inpatient Hospital Stay (HOSPITAL_BASED_OUTPATIENT_CLINIC_OR_DEPARTMENT_OTHER): Payer: Medicare Other | Admitting: Oncology

## 2020-12-05 ENCOUNTER — Encounter: Payer: Self-pay | Admitting: Oncology

## 2020-12-05 VITALS — BP 132/81 | HR 71 | Temp 97.4°F | Resp 16 | Wt 145.2 lb

## 2020-12-05 DIAGNOSIS — R0602 Shortness of breath: Secondary | ICD-10-CM | POA: Insufficient documentation

## 2020-12-05 DIAGNOSIS — R252 Cramp and spasm: Secondary | ICD-10-CM | POA: Diagnosis not present

## 2020-12-05 DIAGNOSIS — R059 Cough, unspecified: Secondary | ICD-10-CM | POA: Insufficient documentation

## 2020-12-05 DIAGNOSIS — K5730 Diverticulosis of large intestine without perforation or abscess without bleeding: Secondary | ICD-10-CM | POA: Diagnosis not present

## 2020-12-05 DIAGNOSIS — Z800 Family history of malignant neoplasm of digestive organs: Secondary | ICD-10-CM | POA: Diagnosis not present

## 2020-12-05 DIAGNOSIS — C7951 Secondary malignant neoplasm of bone: Secondary | ICD-10-CM | POA: Diagnosis not present

## 2020-12-05 DIAGNOSIS — R110 Nausea: Secondary | ICD-10-CM | POA: Insufficient documentation

## 2020-12-05 DIAGNOSIS — E119 Type 2 diabetes mellitus without complications: Secondary | ICD-10-CM | POA: Insufficient documentation

## 2020-12-05 DIAGNOSIS — C3491 Malignant neoplasm of unspecified part of right bronchus or lung: Secondary | ICD-10-CM

## 2020-12-05 DIAGNOSIS — I700 Atherosclerosis of aorta: Secondary | ICD-10-CM | POA: Diagnosis not present

## 2020-12-05 DIAGNOSIS — Z833 Family history of diabetes mellitus: Secondary | ICD-10-CM | POA: Insufficient documentation

## 2020-12-05 DIAGNOSIS — E041 Nontoxic single thyroid nodule: Secondary | ICD-10-CM | POA: Insufficient documentation

## 2020-12-05 DIAGNOSIS — M47816 Spondylosis without myelopathy or radiculopathy, lumbar region: Secondary | ICD-10-CM | POA: Insufficient documentation

## 2020-12-05 DIAGNOSIS — Z79899 Other long term (current) drug therapy: Secondary | ICD-10-CM | POA: Diagnosis not present

## 2020-12-05 DIAGNOSIS — Z8261 Family history of arthritis: Secondary | ICD-10-CM | POA: Diagnosis not present

## 2020-12-05 DIAGNOSIS — C3431 Malignant neoplasm of lower lobe, right bronchus or lung: Secondary | ICD-10-CM | POA: Insufficient documentation

## 2020-12-05 DIAGNOSIS — M47814 Spondylosis without myelopathy or radiculopathy, thoracic region: Secondary | ICD-10-CM | POA: Insufficient documentation

## 2020-12-05 DIAGNOSIS — Z5111 Encounter for antineoplastic chemotherapy: Secondary | ICD-10-CM

## 2020-12-05 LAB — COMPREHENSIVE METABOLIC PANEL
ALT: 13 U/L (ref 0–44)
AST: 19 U/L (ref 15–41)
Albumin: 4.3 g/dL (ref 3.5–5.0)
Alkaline Phosphatase: 39 U/L (ref 38–126)
Anion gap: 10 (ref 5–15)
BUN: 24 mg/dL — ABNORMAL HIGH (ref 8–23)
CO2: 24 mmol/L (ref 22–32)
Calcium: 9.3 mg/dL (ref 8.9–10.3)
Chloride: 103 mmol/L (ref 98–111)
Creatinine, Ser: 1.03 mg/dL — ABNORMAL HIGH (ref 0.44–1.00)
GFR, Estimated: 57 mL/min — ABNORMAL LOW (ref >60)
Glucose, Bld: 146 mg/dL — ABNORMAL HIGH (ref 70–99)
Potassium: 4.2 mmol/L (ref 3.5–5.1)
Sodium: 137 mmol/L (ref 135–145)
Total Bilirubin: 0.4 mg/dL (ref 0.3–1.2)
Total Protein: 7.3 g/dL (ref 6.5–8.1)

## 2020-12-05 LAB — CBC WITH DIFFERENTIAL/PLATELET
Abs Immature Granulocytes: 0.01 10*3/uL (ref 0.00–0.07)
Basophils Absolute: 0 10*3/uL (ref 0.0–0.1)
Basophils Relative: 0 %
Eosinophils Absolute: 0.1 10*3/uL (ref 0.0–0.5)
Eosinophils Relative: 3 %
HCT: 37.4 % (ref 36.0–46.0)
Hemoglobin: 12 g/dL (ref 12.0–15.0)
Immature Granulocytes: 0 %
Lymphocytes Relative: 24 %
Lymphs Abs: 1 10*3/uL (ref 0.7–4.0)
MCH: 31.7 pg (ref 26.0–34.0)
MCHC: 32.1 g/dL (ref 30.0–36.0)
MCV: 98.9 fL (ref 80.0–100.0)
Monocytes Absolute: 0.3 10*3/uL (ref 0.1–1.0)
Monocytes Relative: 8 %
Neutro Abs: 2.6 10*3/uL (ref 1.7–7.7)
Neutrophils Relative %: 65 %
Platelets: 157 10*3/uL (ref 150–400)
RBC: 3.78 MIL/uL — ABNORMAL LOW (ref 3.87–5.11)
RDW: 13 % (ref 11.5–15.5)
WBC: 4 10*3/uL (ref 4.0–10.5)
nRBC: 0 % (ref 0.0–0.2)
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
## 2020-12-05 NOTE — Progress Notes (Signed)
Patient has occasional nausea that is relieved with prescribed medication.  Also has occasional leg cramping.

## 2020-12-06 ENCOUNTER — Other Ambulatory Visit (HOSPITAL_COMMUNITY): Payer: Self-pay

## 2020-12-12 ENCOUNTER — Encounter: Payer: Self-pay | Admitting: Oncology

## 2020-12-12 LAB — CYTOLOGY - NON PAP

## 2020-12-19 ENCOUNTER — Other Ambulatory Visit (HOSPITAL_COMMUNITY): Payer: Self-pay

## 2020-12-21 ENCOUNTER — Telehealth: Payer: Self-pay | Admitting: *Deleted

## 2020-12-21 NOTE — Telephone Encounter (Signed)
Pt called in to report has noticed a rash that developed about 1 week ago under her arms, down both sides, and both inner thighs. Rash is described as small spots that do not itch. Pt has not changed her hygiene routine and has not tried any OTC medications for the rash. Pt didn't know if rash could be from medication and would like recommendations from Dr. Tasia Catchings.   Please advise.

## 2020-12-21 NOTE — Telephone Encounter (Signed)
There is not [facility] clinic tomorrow. Pt agrees to go to [facility] tomorrow for assessment. Pt informed to use Benadryl to see if that helps rash, per MD.

## 2020-12-22 ENCOUNTER — Encounter: Payer: Self-pay | Admitting: Oncology

## 2020-12-22 ENCOUNTER — Other Ambulatory Visit: Payer: Self-pay

## 2020-12-22 ENCOUNTER — Inpatient Hospital Stay: Payer: Medicare Other | Attending: Oncology | Admitting: Oncology

## 2020-12-22 VITALS — BP 156/56 | HR 80 | Temp 97.8°F | Resp 16 | Wt 142.0 lb

## 2020-12-22 DIAGNOSIS — C7951 Secondary malignant neoplasm of bone: Secondary | ICD-10-CM | POA: Diagnosis not present

## 2020-12-22 DIAGNOSIS — C3431 Malignant neoplasm of lower lobe, right bronchus or lung: Secondary | ICD-10-CM | POA: Diagnosis present

## 2020-12-22 DIAGNOSIS — E041 Nontoxic single thyroid nodule: Secondary | ICD-10-CM | POA: Diagnosis not present

## 2020-12-22 DIAGNOSIS — Z833 Family history of diabetes mellitus: Secondary | ICD-10-CM | POA: Insufficient documentation

## 2020-12-22 DIAGNOSIS — I700 Atherosclerosis of aorta: Secondary | ICD-10-CM | POA: Diagnosis not present

## 2020-12-22 DIAGNOSIS — Z8261 Family history of arthritis: Secondary | ICD-10-CM | POA: Insufficient documentation

## 2020-12-22 DIAGNOSIS — Z79899 Other long term (current) drug therapy: Secondary | ICD-10-CM | POA: Diagnosis not present

## 2020-12-22 DIAGNOSIS — Z800 Family history of malignant neoplasm of digestive organs: Secondary | ICD-10-CM | POA: Diagnosis not present

## 2020-12-22 DIAGNOSIS — C778 Secondary and unspecified malignant neoplasm of lymph nodes of multiple regions: Secondary | ICD-10-CM | POA: Insufficient documentation

## 2020-12-22 DIAGNOSIS — C3491 Malignant neoplasm of unspecified part of right bronchus or lung: Secondary | ICD-10-CM

## 2020-12-22 DIAGNOSIS — Z87891 Personal history of nicotine dependence: Secondary | ICD-10-CM | POA: Insufficient documentation

## 2020-12-22 DIAGNOSIS — R0602 Shortness of breath: Secondary | ICD-10-CM | POA: Diagnosis not present

## 2020-12-22 DIAGNOSIS — L819 Disorder of pigmentation, unspecified: Secondary | ICD-10-CM | POA: Diagnosis not present
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
## 2020-12-26 ENCOUNTER — Other Ambulatory Visit (HOSPITAL_COMMUNITY): Payer: Self-pay

## 2021-01-06 ENCOUNTER — Other Ambulatory Visit (HOSPITAL_COMMUNITY): Payer: Self-pay

## 2021-01-17 ENCOUNTER — Inpatient Hospital Stay: Payer: Medicare Other | Attending: Oncology

## 2021-01-17 DIAGNOSIS — C3491 Malignant neoplasm of unspecified part of right bronchus or lung: Secondary | ICD-10-CM | POA: Diagnosis not present

## 2021-01-17 LAB — CBC WITH DIFFERENTIAL/PLATELET
Abs Immature Granulocytes: 0.01 10*3/uL (ref 0.00–0.07)
Basophils Absolute: 0 10*3/uL (ref 0.0–0.1)
Basophils Relative: 1 %
Eosinophils Absolute: 0.1 10*3/uL (ref 0.0–0.5)
Eosinophils Relative: 4 %
HCT: 36.5 % (ref 36.0–46.0)
Hemoglobin: 12.2 g/dL (ref 12.0–15.0)
Immature Granulocytes: 0 %
Lymphocytes Relative: 32 %
Lymphs Abs: 1.2 10*3/uL (ref 0.7–4.0)
MCH: 32.2 pg (ref 26.0–34.0)
MCHC: 33.4 g/dL (ref 30.0–36.0)
MCV: 96.3 fL (ref 80.0–100.0)
Monocytes Absolute: 0.4 10*3/uL (ref 0.1–1.0)
Monocytes Relative: 10 %
Neutro Abs: 2 10*3/uL (ref 1.7–7.7)
Neutrophils Relative %: 53 %
Platelets: 168 10*3/uL (ref 150–400)
RBC: 3.79 MIL/uL — ABNORMAL LOW (ref 3.87–5.11)
RDW: 13.1 % (ref 11.5–15.5)
WBC: 3.7 10*3/uL — ABNORMAL LOW (ref 4.0–10.5)
nRBC: 0 % (ref 0.0–0.2)

## 2021-01-17 LAB — COMPREHENSIVE METABOLIC PANEL
ALT: 14 U/L (ref 0–44)
AST: 17 U/L (ref 15–41)
Albumin: 4.3 g/dL (ref 3.5–5.0)
Alkaline Phosphatase: 43 U/L (ref 38–126)
Anion gap: 6 (ref 5–15)
BUN: 19 mg/dL (ref 8–23)
CO2: 26 mmol/L (ref 22–32)
Calcium: 9 mg/dL (ref 8.9–10.3)
Chloride: 105 mmol/L (ref 98–111)
Creatinine, Ser: 0.99 mg/dL (ref 0.44–1.00)
GFR, Estimated: >60 mL/min (ref >60)
Glucose, Bld: 119 mg/dL — ABNORMAL HIGH (ref 70–99)
Potassium: 4.2 mmol/L (ref 3.5–5.1)
Sodium: 137 mmol/L (ref 135–145)
Total Bilirubin: 0.4 mg/dL (ref 0.3–1.2)
Total Protein: 7.1 g/dL (ref 6.5–8.1)

## 2021-01-18 ENCOUNTER — Other Ambulatory Visit: Payer: Medicare Other

## 2021-01-18 ENCOUNTER — Other Ambulatory Visit: Payer: Self-pay

## 2021-01-18 ENCOUNTER — Ambulatory Visit
Admission: RE | Admit: 2021-01-18 | Discharge: 2021-01-18 | Disposition: A | Discharge status: Home / Self Care | Payer: Medicare Other | Source: Ambulatory Visit | Attending: Oncology | Admitting: Oncology

## 2021-01-18 ENCOUNTER — Other Ambulatory Visit (HOSPITAL_COMMUNITY): Payer: Self-pay

## 2021-01-18 DIAGNOSIS — C3491 Malignant neoplasm of unspecified part of right bronchus or lung: Secondary | ICD-10-CM | POA: Insufficient documentation

## 2021-01-19 ENCOUNTER — Telehealth: Payer: Self-pay | Admitting: General Surgery

## 2021-01-19 ENCOUNTER — Ambulatory Visit (INDEPENDENT_AMBULATORY_CARE_PROVIDER_SITE_OTHER): Payer: Medicare Other | Admitting: General Surgery

## 2021-01-19 ENCOUNTER — Ambulatory Visit: Payer: Self-pay

## 2021-01-19 ENCOUNTER — Encounter: Payer: Self-pay | Admitting: General Surgery

## 2021-01-19 VITALS — BP 146/66 | HR 69 | Temp 98.0°F | Ht 64.5 in | Wt 146.6 lb

## 2021-01-19 DIAGNOSIS — C73 Malignant neoplasm of thyroid gland: Secondary | ICD-10-CM

## 2021-01-19 DIAGNOSIS — E041 Nontoxic single thyroid nodule: Secondary | ICD-10-CM
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
## 2021-01-19 NOTE — Telephone Encounter (Signed)
Patient has been advised of Pre-Admission date/time, COVID Testing date and Surgery date.  Surgery Date: 02/08/21 Preadmission Testing Date: 01/30/21 (phone 8a-1p) Covid Testing Date: 02/06/21 @ 8:25 am - patient advised to go to the [facility] ([place])   Patient has been made aware to call [phone], between 1-3:00pm the day before surgery, to find out what time to arrive for surgery.
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
## 2021-01-19 NOTE — Patient Instructions (Signed)
Our surgery scheduler Pamala Hurry will call you within 24-48 hours to get you scheduled. If you have not heard from her after 48 hours, please call our office. You will need to get Covid tested before surgery and have the blue sheet available when she calls to write down important information.   If you have any concerns or questions, please feel free to call our office.  Thyroid Nodule A thyroid nodule is an isolated growth of thyroid cells that forms a lump in your thyroid gland. The thyroid gland is a butterfly-shaped gland. It is found in the lower front of your neck. This gland sends chemical messengers (hormones) through your blood to all parts of your body. These hormones are important in regulating your body temperature and helping your body to use energy. Thyroid nodules are common. Most are not cancerous (benign). You may have one nodule or several nodules. Different types of thyroid nodules include nodules that: Grow and fill with fluid (thyroid cysts). Produce too much thyroid hormone (hot nodules or hyperthyroid). Produce no thyroid hormone (cold nodules or hypothyroid). Form from cancer cells (thyroid cancers). What are the causes? In most cases, the cause of this condition is not known. What increases the risk? The following factors may make you more likely to develop this condition. Age. Thyroid nodules become more common in people who are older than 73 years of age. Gender. Benign thyroid nodules are more common in women. Cancerous (malignant) thyroid nodules are more common in men. A family history that includes: Thyroid nodules. Pheochromocytoma. Thyroid carcinoma. Hyperparathyroidism. Certain kinds of thyroid diseases, such as Hashimoto's thyroiditis. Lack of iodine in your diet. A history of head and neck radiation, such as from previous cancer treatment. What are the signs or symptoms? In many cases, there are no symptoms. If you have symptoms, they may include: A lump  in your lower neck. Feeling a lump or tickle in your throat. Pain in your neck, jaw, or ear. Having trouble swallowing. Hot nodules may cause symptoms that include: Weight loss. Warm, flushed skin. Feeling hot. Feeling nervous. A racing heartbeat. Cold nodules may cause symptoms that include: Weight gain. Dry skin. Brittle hair. This may also occur with hair loss. Feeling cold. Fatigue. Thyroid cancer nodules may cause symptoms that include: Hard nodules that feel stuck to the thyroid gland. Hoarseness. Lumps in the glands near your thyroid (lymph nodes). How is this diagnosed? A thyroid nodule may be felt by your health care provider during a physical exam. This condition may also be diagnosed based on your symptoms. You may also have tests, including: An ultrasound. This may be done to confirm the diagnosis. A biopsy. This involves taking a sample from the nodule and looking at it under a microscope. Blood tests to make sure that your thyroid is working properly. A thyroid scan. This test uses a radioactive tracer injected into a vein to create an image of the thyroid gland on a computer screen. Imaging tests such as MRI or CT scan. These may be done if: Your nodule is large. Your nodule is blocking your airway. Cancer is suspected. How is this treated? Treatment depends on the cause and size of your nodule or nodules. If the nodule is benign, treatment may not be necessary. Your health care provider may monitor the nodule to see if it goes away without treatment. If the nodule continues to grow, is cancerous, or does not go away, treatment may be needed. Treatment may include: Having a cystic nodule drained  with a needle. Ablation therapy. In this treatment, alcohol is injected into the area of the nodule to destroy the cells. Ablation with heat (thermal ablation) may also be used. Radioactive iodine. In this treatment, radioactive iodine is given as a pill or liquid that you  drink. This substance causes the thyroid nodule to shrink. Surgery to remove the nodule. Part or all of your thyroid gland may need to be removed as well. Medicines. Follow these instructions at home: Pay attention to any changes in your nodule. Take over-the-counter and prescription medicines only as told by your health care provider. Keep all follow-up visits as told by your health care provider. This is important. Contact a health care provider if: Your voice changes. You have trouble swallowing. You have pain in your neck, ear, or jaw that is getting worse. Your nodule gets bigger. Your nodule starts to make it harder for you to breathe. Your muscles look like they are shrinking (muscle wasting). Get help right away if: You have chest pain. There is a loss of consciousness. You have a sudden fever. You feel confused. You are seeing or hearing things that other people do not see or hear (having hallucinations). You feel very weak. You have mood swings. You feel very restless. You feel suddenly nauseous or throw up. You suddenly have diarrhea. Summary A thyroid nodule is an isolated growth of thyroid cells that forms a lump in your thyroid gland. Thyroid nodules are common. Most are not cancerous (benign). You may have one nodule or several nodules. Treatment depends on the cause and size of your nodule or nodules. If the nodule is benign, treatment may not be necessary. Your health care provider may monitor the nodule to see if it goes away without treatment. If the nodule continues to grow, is cancerous, or does not go away, treatment may be needed. This information is not intended to replace advice given to you by your health care provider. Make sure you discuss any questions you have with your health care provider. Document Revised: 12/20/2017 Document Reviewed: 12/23/2017 Elsevier Patient Education  [place].

## 2021-01-20 ENCOUNTER — Ambulatory Visit: Payer: Medicare Other | Admitting: Oncology

## 2021-01-20 ENCOUNTER — Inpatient Hospital Stay: Payer: Medicare Other

## 2021-01-20 ENCOUNTER — Encounter: Payer: Self-pay | Admitting: Oncology

## 2021-01-20 ENCOUNTER — Inpatient Hospital Stay: Payer: Medicare Other | Attending: Oncology | Admitting: Oncology

## 2021-01-20 ENCOUNTER — Other Ambulatory Visit: Payer: Medicare Other

## 2021-01-20 VITALS — BP 125/60 | HR 78 | Temp 97.4°F | Resp 16 | Wt 146.0 lb

## 2021-01-20 DIAGNOSIS — R0602 Shortness of breath: Secondary | ICD-10-CM | POA: Insufficient documentation

## 2021-01-20 DIAGNOSIS — Z8261 Family history of arthritis: Secondary | ICD-10-CM | POA: Insufficient documentation

## 2021-01-20 DIAGNOSIS — K429 Umbilical hernia without obstruction or gangrene: Secondary | ICD-10-CM | POA: Insufficient documentation

## 2021-01-20 DIAGNOSIS — Z79899 Other long term (current) drug therapy: Secondary | ICD-10-CM | POA: Insufficient documentation

## 2021-01-20 DIAGNOSIS — C3431 Malignant neoplasm of lower lobe, right bronchus or lung: Secondary | ICD-10-CM | POA: Diagnosis not present

## 2021-01-20 DIAGNOSIS — R21 Rash and other nonspecific skin eruption: Secondary | ICD-10-CM | POA: Diagnosis not present

## 2021-01-20 DIAGNOSIS — R590 Localized enlarged lymph nodes: Secondary | ICD-10-CM | POA: Diagnosis not present

## 2021-01-20 DIAGNOSIS — Z833 Family history of diabetes mellitus: Secondary | ICD-10-CM | POA: Insufficient documentation

## 2021-01-20 DIAGNOSIS — C7951 Secondary malignant neoplasm of bone: Secondary | ICD-10-CM | POA: Diagnosis not present

## 2021-01-20 DIAGNOSIS — I700 Atherosclerosis of aorta: Secondary | ICD-10-CM | POA: Diagnosis not present

## 2021-01-20 DIAGNOSIS — E041 Nontoxic single thyroid nodule: Secondary | ICD-10-CM

## 2021-01-20 DIAGNOSIS — Z5111 Encounter for antineoplastic chemotherapy: Secondary | ICD-10-CM

## 2021-01-20 DIAGNOSIS — Z87891 Personal history of nicotine dependence: Secondary | ICD-10-CM | POA: Diagnosis not present

## 2021-01-20 DIAGNOSIS — C3491 Malignant neoplasm of unspecified part of right bronchus or lung: Secondary | ICD-10-CM | POA: Diagnosis not present

## 2021-01-20 DIAGNOSIS — Z800 Family history of malignant neoplasm of digestive organs: Secondary | ICD-10-CM | POA: Insufficient documentation

## 2021-01-20 MED ORDER — DENOSUMAB 120 MG/1.7ML ~~LOC~~ SOLN
120.0000 mg | Freq: Once | SUBCUTANEOUS | Status: AC
  Administered 2021-01-20: 120 mg via SUBCUTANEOUS
  Filled 2021-01-20: qty 1.7

## 2021-01-20 NOTE — Progress Notes (Signed)
Pt in follow up, states previous rash has improved but is itching now.
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
## 2021-01-24 ENCOUNTER — Other Ambulatory Visit (HOSPITAL_COMMUNITY): Payer: Self-pay

## 2021-01-30 ENCOUNTER — Other Ambulatory Visit: Payer: Self-pay

## 2021-01-30 ENCOUNTER — Encounter
Admission: RE | Admit: 2021-01-30 | Discharge: 2021-01-30 | Disposition: A | Discharge status: Home / Self Care | Payer: Medicare Other | Source: Ambulatory Visit | Attending: General Surgery | Admitting: General Surgery

## 2021-01-30 HISTORY — DX: Malignant (primary) neoplasm, unspecified: C80.1

## 2021-01-30 NOTE — Patient Instructions (Signed)
Your procedure is scheduled on: 02/08/21 Report to [facility]. To find out your arrival time please call [phone] between 1PM - 3PM on 02/07/21.  Remember: Instructions that are not followed completely may result in serious medical risk, up to and including death, or upon the discretion of your surgeon and anesthesiologist your surgery may need to be rescheduled.     _X__ 1. Do not eat food after midnight the night before your procedure.                 No gum chewing or hard candies.   __X__2.  On the morning of surgery brush your teeth with toothpaste and water, you                 may rinse your mouth with mouthwash if you wish.  Do not swallow any              toothpaste of mouthwash.     _X__ 3.  No Alcohol for 24 hours before or after surgery.   _X__ 4.  Do Not Smoke or use e-cigarettes For 24 Hours Prior to Your Surgery.                 Do not use any chewable tobacco products for at least 6 hours prior to                 surgery.  ____  5.  Bring all medications with you on the day of surgery if instructed.   __X__  6.  Notify your doctor if there is any change in your medical condition      (cold, fever, infections).     Do not wear jewelry, make-up, hairpins, clips or nail polish. Do not wear lotions, powders, or perfumes.  Do not shave 48 hours prior to surgery. Men may shave face and neck. Do not bring valuables to the hospital.    [facility] is not responsible for any belongings or valuables.  Contacts, dentures/partials or body piercings may not be worn into surgery. Bring a case for your contacts, glasses or hearing aids, a denture cup will be supplied. Leave your suitcase in the car. After surgery it may be brought to your room. For patients admitted to the hospital, discharge time is determined by your treatment team.   Patients discharged the day of surgery will not be allowed to drive home.   Please  read over the following fact sheets that you were given:   CHG soap  __X__ Take these medicines the morning of surgery with A SIP OF WATER:    1. none  2.   3.   4.  5.  6.  ____ Fleet Enema (as directed)   __X__ Use CHG Soap/SAGE wipes as directed  ____ Use inhalers on the day of surgery  __X__ Stop metformin/Janumet/Farxiga 2 days prior to surgery    ____ Take 1/2 of usual insulin dose the night before surgery. No insulin the morning          of surgery.   ____ Stop Blood Thinners Coumadin/Plavix/Xarelto/Pleta/Pradaxa/Eliquis/Effient/Aspirin  on   Or contact your Surgeon, Cardiologist or Medical Doctor regarding  ability to stop your blood thinners  __X__ Stop Anti-inflammatories 7 days before surgery such as Advil, Ibuprofen, Motrin,  BC or Goodies Powder, Naprosyn, Naproxen, Aleve, Aspirin    __X__ Stop all herbal supplements, fish oil or vitamin E until after surgery.  ____ Bring C-Pap to the hospital.    STOP ALL SUPPLEMENT FOR 7 DAYS PRIOR TO SURGERY

## 2021-01-31 ENCOUNTER — Other Ambulatory Visit
Admission: RE | Admit: 2021-01-31 | Discharge: 2021-01-31 | Disposition: A | Discharge status: Home / Self Care | Payer: Medicare Other | Source: Ambulatory Visit | Attending: General Surgery | Admitting: General Surgery

## 2021-01-31 DIAGNOSIS — Z01810 Encounter for preprocedural cardiovascular examination: Secondary | ICD-10-CM | POA: Diagnosis not present

## 2021-02-06 ENCOUNTER — Other Ambulatory Visit: Payer: Self-pay

## 2021-02-06 ENCOUNTER — Other Ambulatory Visit
Admission: RE | Admit: 2021-02-06 | Discharge: 2021-02-06 | Disposition: A | Discharge status: Home / Self Care | Payer: Medicare Other | Source: Ambulatory Visit | Attending: General Surgery | Admitting: General Surgery

## 2021-02-06 DIAGNOSIS — Z20822 Contact with and (suspected) exposure to covid-19: Secondary | ICD-10-CM | POA: Diagnosis not present

## 2021-02-06 DIAGNOSIS — Z01812 Encounter for preprocedural laboratory examination: Secondary | ICD-10-CM | POA: Diagnosis present

## 2021-02-06 LAB — SARS CORONAVIRUS 2 (TAT 6-24 HRS): SARS Coronavirus 2: NEGATIVE

## 2021-02-08 ENCOUNTER — Other Ambulatory Visit: Payer: Self-pay

## 2021-02-08 ENCOUNTER — Ambulatory Visit: Payer: Medicare Other | Admitting: Anesthesiology

## 2021-02-08 ENCOUNTER — Encounter
Admission: RE | Disposition: A | Discharge status: Home / Self Care | Payer: Self-pay | Source: Home / Self Care | Attending: General Surgery

## 2021-02-08 ENCOUNTER — Observation Stay
Admission: RE | Admit: 2021-02-08 | Discharge: 2021-02-09 | Disposition: A | Discharge status: Home / Self Care | Payer: Medicare Other | Attending: General Surgery | Admitting: General Surgery

## 2021-02-08 ENCOUNTER — Encounter: Payer: Self-pay | Admitting: General Surgery

## 2021-02-08 DIAGNOSIS — C3431 Malignant neoplasm of lower lobe, right bronchus or lung: Secondary | ICD-10-CM

## 2021-02-08 DIAGNOSIS — C73 Malignant neoplasm of thyroid gland: Secondary | ICD-10-CM | POA: Insufficient documentation

## 2021-02-08 DIAGNOSIS — Z85118 Personal history of other malignant neoplasm of bronchus and lung: Secondary | ICD-10-CM | POA: Insufficient documentation

## 2021-02-08 DIAGNOSIS — E119 Type 2 diabetes mellitus without complications: Secondary | ICD-10-CM | POA: Diagnosis not present

## 2021-02-08 DIAGNOSIS — Z87891 Personal history of nicotine dependence: Secondary | ICD-10-CM | POA: Insufficient documentation

## 2021-02-08 DIAGNOSIS — Z7984 Long term (current) use of oral hypoglycemic drugs: Secondary | ICD-10-CM | POA: Insufficient documentation

## 2021-02-08 DIAGNOSIS — D440 Neoplasm of uncertain behavior of thyroid gland: Secondary | ICD-10-CM

## 2021-02-08 DIAGNOSIS — E041 Nontoxic single thyroid nodule: Secondary | ICD-10-CM

## 2021-02-08 DIAGNOSIS — Z79899 Other long term (current) drug therapy: Secondary | ICD-10-CM | POA: Insufficient documentation

## 2021-02-08 DIAGNOSIS — C3411 Malignant neoplasm of upper lobe, right bronchus or lung: Secondary | ICD-10-CM

## 2021-02-08 DIAGNOSIS — E890 Postprocedural hypothyroidism: Secondary | ICD-10-CM

## 2021-02-08 HISTORY — PX: THYROID LOBECTOMY: SHX420

## 2021-02-08 LAB — GLUCOSE, CAPILLARY
Glucose-Capillary: 157 mg/dL — ABNORMAL HIGH (ref 70–99)
Glucose-Capillary: 137 mg/dL — ABNORMAL HIGH (ref 70–99)
Glucose-Capillary: 153 mg/dL — ABNORMAL HIGH (ref 70–99)
Glucose-Capillary: 142 mg/dL — ABNORMAL HIGH (ref 70–99)
Glucose-Capillary: 170 mg/dL — ABNORMAL HIGH (ref 70–99)

## 2021-02-08 SURGERY — LOBECTOMY, THYROID
Anesthesia: General | Laterality: Left

## 2021-02-08 MED ORDER — FENTANYL CITRATE (PF) 100 MCG/2ML IJ SOLN
INTRAMUSCULAR | Status: DC | PRN
  Administered 2021-02-08 (×2): 25 ug via INTRAVENOUS
  Administered 2021-02-08: 50 ug via INTRAVENOUS

## 2021-02-08 MED ORDER — ACETAMINOPHEN 500 MG PO TABS
ORAL_TABLET | ORAL | Status: AC
  Administered 2021-02-08: 1000 mg via ORAL
  Filled 2021-02-08: qty 2

## 2021-02-08 MED ORDER — FAMOTIDINE 20 MG PO TABS: 20.0000 mg | ORAL_TABLET | Freq: Once | ORAL | Status: AC

## 2021-02-08 MED ORDER — ORAL CARE MOUTH RINSE
15.0000 mL | Freq: Once | OROMUCOSAL | Status: AC
Start: 2021-02-08 — End: 2021-02-08

## 2021-02-08 MED ORDER — OXYCODONE HCL 5 MG/5ML PO SOLN: 5.0000 mg | Freq: Once | ORAL | Status: AC | PRN

## 2021-02-08 MED ORDER — ACETAMINOPHEN 500 MG PO TABS
1000.0000 mg | ORAL_TABLET | Freq: Four times a day (QID) | ORAL | Status: DC
  Administered 2021-02-08 – 2021-02-09 (×2): 1000 mg via ORAL
  Filled 2021-02-08 (×2): qty 2

## 2021-02-08 MED ORDER — CHLORHEXIDINE GLUCONATE CLOTH 2 % EX PADS: 6.0000 | MEDICATED_PAD | Freq: Once | CUTANEOUS | Status: DC

## 2021-02-08 MED ORDER — PROPOFOL 10 MG/ML IV BOLUS
INTRAVENOUS | Status: AC
  Filled 2021-02-08: qty 20

## 2021-02-08 MED ORDER — MIDAZOLAM HCL 2 MG/2ML IJ SOLN
INTRAMUSCULAR | Status: DC | PRN
  Administered 2021-02-08: 2 mg via INTRAVENOUS

## 2021-02-08 MED ORDER — MENTHOL 3 MG MT LOZG
1.0000 | LOZENGE | OROMUCOSAL | Status: DC | PRN
  Filled 2021-02-08: qty 9

## 2021-02-08 MED ORDER — ROCURONIUM BROMIDE 10 MG/ML (PF) SYRINGE
PREFILLED_SYRINGE | INTRAVENOUS | Status: AC
  Filled 2021-02-08: qty 10

## 2021-02-08 MED ORDER — MAGNESIUM OXIDE -MG SUPPLEMENT 400 (240 MG) MG PO TABS
200.0000 mg | ORAL_TABLET | Freq: Every day | ORAL | Status: DC
  Administered 2021-02-08 – 2021-02-09 (×2): 200 mg via ORAL
  Filled 2021-02-08: qty 1
  Filled 2021-02-08: qty 0.5

## 2021-02-08 MED ORDER — ADULT MULTIVITAMIN W/MINERALS CH
1.0000 | ORAL_TABLET | Freq: Every day | ORAL | Status: DC
  Administered 2021-02-08 – 2021-02-09 (×2): 1 via ORAL
  Filled 2021-02-08 (×2): qty 1

## 2021-02-08 MED ORDER — OXYCODONE HCL 5 MG PO TABS: 5.0000 mg | ORAL_TABLET | ORAL | Status: DC | PRN

## 2021-02-08 MED ORDER — REMIFENTANIL HCL 1 MG IV SOLR
INTRAVENOUS | Status: AC
  Filled 2021-02-08: qty 1000

## 2021-02-08 MED ORDER — POLYVINYL ALCOHOL 1.4 % OP SOLN
1.0000 [drp] | Freq: Three times a day (TID) | OPHTHALMIC | Status: DC | PRN
  Filled 2021-02-08: qty 15

## 2021-02-08 MED ORDER — VITAMIN D3 25 MCG (1000 UNIT) PO TABS
5000.0000 [IU] | ORAL_TABLET | Freq: Every day | ORAL | Status: DC
  Administered 2021-02-08 – 2021-02-09 (×2): 5000 [IU] via ORAL
  Filled 2021-02-08 (×3): qty 5

## 2021-02-08 MED ORDER — LIDOCAINE HCL (CARDIAC) PF 100 MG/5ML IV SOSY
PREFILLED_SYRINGE | INTRAVENOUS | Status: DC | PRN
  Administered 2021-02-08: 60 mg via INTRAVENOUS

## 2021-02-08 MED ORDER — OXYCODONE HCL 5 MG PO TABS
ORAL_TABLET | ORAL | Status: AC
  Administered 2021-02-08: 5 mg via ORAL
  Filled 2021-02-08: qty 1

## 2021-02-08 MED ORDER — HYDROCORTISONE 0.5 % EX CREA
1.0000 "application " | TOPICAL_CREAM | Freq: Every day | CUTANEOUS | Status: DC | PRN
  Filled 2021-02-08: qty 28.35

## 2021-02-08 MED ORDER — SODIUM CHLORIDE 0.9 % IV SOLN
INTRAVENOUS | Status: DC | PRN
  Administered 2021-02-08: 15 ug/min via INTRAVENOUS

## 2021-02-08 MED ORDER — IBUPROFEN 400 MG PO TABS
600.0000 mg | ORAL_TABLET | Freq: Four times a day (QID) | ORAL | Status: DC | PRN
  Filled 2021-02-08 (×2): qty 1

## 2021-02-08 MED ORDER — OXYCODONE HCL 5 MG PO TABS
ORAL_TABLET | ORAL | Status: AC
  Filled 2021-02-08: qty 1

## 2021-02-08 MED ORDER — FENTANYL CITRATE (PF) 100 MCG/2ML IJ SOLN
INTRAMUSCULAR | Status: AC
  Administered 2021-02-08: 25 ug via INTRAVENOUS
  Filled 2021-02-08: qty 2

## 2021-02-08 MED ORDER — TRAMADOL HCL 50 MG PO TABS: 50.0000 mg | ORAL_TABLET | Freq: Four times a day (QID) | ORAL | Status: DC | PRN

## 2021-02-08 MED ORDER — PROPOFOL 500 MG/50ML IV EMUL
INTRAVENOUS | Status: AC
  Filled 2021-02-08: qty 50

## 2021-02-08 MED ORDER — EPHEDRINE SULFATE 50 MG/ML IJ SOLN
INTRAMUSCULAR | Status: DC | PRN
  Administered 2021-02-08: 5 mg via INTRAVENOUS

## 2021-02-08 MED ORDER — SUCCINYLCHOLINE CHLORIDE 200 MG/10ML IV SOSY
PREFILLED_SYRINGE | INTRAVENOUS | Status: AC
  Filled 2021-02-08: qty 10

## 2021-02-08 MED ORDER — ACETAMINOPHEN 10 MG/ML IV SOLN: 1000.0000 mg | Freq: Once | INTRAVENOUS | Status: DC | PRN

## 2021-02-08 MED ORDER — MIDAZOLAM HCL 2 MG/2ML IJ SOLN
INTRAMUSCULAR | Status: AC
  Filled 2021-02-08: qty 2

## 2021-02-08 MED ORDER — PROPOFOL 500 MG/50ML IV EMUL
INTRAVENOUS | Status: DC | PRN
  Administered 2021-02-08: 125 ug/kg/min via INTRAVENOUS

## 2021-02-08 MED ORDER — INSULIN ASPART 100 UNIT/ML IJ SOLN
INTRAMUSCULAR | Status: AC
  Filled 2021-02-08: qty 1

## 2021-02-08 MED ORDER — SODIUM CHLORIDE 0.9 % IV SOLN: INTRAVENOUS | Status: DC

## 2021-02-08 MED ORDER — LIDOCAINE HCL (PF) 2 % IJ SOLN
INTRAMUSCULAR | Status: AC
  Filled 2021-02-08: qty 5

## 2021-02-08 MED ORDER — MECLIZINE HCL 25 MG PO TABS
25.0000 mg | ORAL_TABLET | Freq: Three times a day (TID) | ORAL | Status: DC | PRN
  Filled 2021-02-08: qty 1

## 2021-02-08 MED ORDER — INSULIN ASPART 100 UNIT/ML IJ SOLN
0.0000 [IU] | Freq: Three times a day (TID) | INTRAMUSCULAR | Status: DC
  Administered 2021-02-08: 3 [IU] via SUBCUTANEOUS
  Administered 2021-02-09: 2 [IU] via SUBCUTANEOUS
  Filled 2021-02-08 (×2): qty 1

## 2021-02-08 MED ORDER — LISINOPRIL 2.5 MG PO TABS
2.5000 mg | ORAL_TABLET | Freq: Every day | ORAL | Status: DC
  Administered 2021-02-08 – 2021-02-09 (×2): 2.5 mg via ORAL
  Filled 2021-02-08 (×2): qty 1

## 2021-02-08 MED ORDER — PROPOFOL 10 MG/ML IV BOLUS
INTRAVENOUS | Status: DC | PRN
Start: 2021-02-08 — End: 2021-02-08
  Administered 2021-02-08: 120 mg via INTRAVENOUS

## 2021-02-08 MED ORDER — ONDANSETRON HCL 4 MG/2ML IJ SOLN
4.0000 mg | Freq: Four times a day (QID) | INTRAMUSCULAR | Status: DC | PRN
  Administered 2021-02-08: 4 mg via INTRAVENOUS
  Filled 2021-02-08: qty 2

## 2021-02-08 MED ORDER — IBUPROFEN 600 MG PO TABS
ORAL_TABLET | ORAL | Status: AC
  Administered 2021-02-08: 600 mg via ORAL
  Filled 2021-02-08: qty 1

## 2021-02-08 MED ORDER — PHENYLEPHRINE HCL (PRESSORS) 10 MG/ML IV SOLN
INTRAVENOUS | Status: AC
  Filled 2021-02-08: qty 2

## 2021-02-08 MED ORDER — FENTANYL CITRATE (PF) 100 MCG/2ML IJ SOLN
INTRAMUSCULAR | Status: AC
  Filled 2021-02-08: qty 2

## 2021-02-08 MED ORDER — CHLORHEXIDINE GLUCONATE 0.12 % MT SOLN: 15.0000 mL | Freq: Once | OROMUCOSAL | Status: AC

## 2021-02-08 MED ORDER — REMIFENTANIL HCL 1 MG IV SOLR
INTRAVENOUS | Status: DC | PRN
  Administered 2021-02-08: .2 ug/kg/min via INTRAVENOUS

## 2021-02-08 MED ORDER — 0.9 % SODIUM CHLORIDE (POUR BTL) OPTIME
TOPICAL | Status: DC | PRN
  Administered 2021-02-08: 50 mL

## 2021-02-08 MED ORDER — ACETAMINOPHEN 500 MG PO TABS: 1000.0000 mg | ORAL_TABLET | ORAL | Status: AC

## 2021-02-08 MED ORDER — PHENOL 1.4 % MT LIQD
1.0000 | OROMUCOSAL | Status: DC | PRN
  Filled 2021-02-08: qty 177

## 2021-02-08 MED ORDER — OSIMERTINIB MESYLATE 80 MG PO TABS
80.0000 mg | ORAL_TABLET | Freq: Every day | ORAL | Status: DC
Start: 2021-02-08 — End: 2021-02-09
  Administered 2021-02-08: 80 mg via ORAL
  Filled 2021-02-08 (×3): qty 1

## 2021-02-08 MED ORDER — ONDANSETRON HCL 4 MG/2ML IJ SOLN: 4.0000 mg | Freq: Once | INTRAMUSCULAR | Status: DC | PRN

## 2021-02-08 MED ORDER — SIMETHICONE 80 MG PO CHEW
40.0000 mg | CHEWABLE_TABLET | Freq: Four times a day (QID) | ORAL | Status: DC | PRN
  Filled 2021-02-08: qty 1

## 2021-02-08 MED ORDER — ONDANSETRON 4 MG PO TBDP: 4.0000 mg | ORAL_TABLET | Freq: Four times a day (QID) | ORAL | Status: DC | PRN

## 2021-02-08 MED ORDER — OXYCODONE HCL 5 MG PO TABS
5.0000 mg | ORAL_TABLET | Freq: Once | ORAL | Status: AC | PRN
  Administered 2021-02-08: 5 mg via ORAL

## 2021-02-08 MED ORDER — SUCCINYLCHOLINE CHLORIDE 200 MG/10ML IV SOSY
PREFILLED_SYRINGE | INTRAVENOUS | Status: DC | PRN
  Administered 2021-02-08: 80 mg via INTRAVENOUS

## 2021-02-08 MED ORDER — ONDANSETRON HCL 4 MG/2ML IJ SOLN
INTRAMUSCULAR | Status: DC | PRN
  Administered 2021-02-08: 4 mg via INTRAVENOUS

## 2021-02-08 MED ORDER — CHLORHEXIDINE GLUCONATE 0.12 % MT SOLN
OROMUCOSAL | Status: AC
  Administered 2021-02-08: 15 mL via OROMUCOSAL
  Filled 2021-02-08: qty 15

## 2021-02-08 MED ORDER — PROPOFOL 1000 MG/100ML IV EMUL
INTRAVENOUS | Status: AC
  Filled 2021-02-08: qty 200

## 2021-02-08 MED ORDER — ACETAMINOPHEN 10 MG/ML IV SOLN
INTRAVENOUS | Status: AC
  Filled 2021-02-08: qty 100

## 2021-02-08 MED ORDER — FAMOTIDINE 20 MG PO TABS
ORAL_TABLET | ORAL | Status: AC
  Administered 2021-02-08: 20 mg via ORAL
  Filled 2021-02-08: qty 1

## 2021-02-08 MED ORDER — HEMOSTATIC AGENTS (NO CHARGE) OPTIME
TOPICAL | Status: DC | PRN
  Administered 2021-02-08: 1 via TOPICAL

## 2021-02-08 MED ORDER — "VISTASEAL 4 ML SINGLE DOSE KIT "
PACK | CUTANEOUS | Status: DC | PRN
  Administered 2021-02-08: 4 mL via TOPICAL

## 2021-02-08 MED ORDER — PROCHLORPERAZINE MALEATE 10 MG PO TABS
10.0000 mg | ORAL_TABLET | Freq: Four times a day (QID) | ORAL | Status: DC | PRN
  Filled 2021-02-08: qty 1

## 2021-02-08 MED ORDER — FENTANYL CITRATE (PF) 100 MCG/2ML IJ SOLN
25.0000 ug | INTRAMUSCULAR | Status: DC | PRN
  Administered 2021-02-08: 25 ug via INTRAVENOUS
  Administered 2021-02-08: 50 ug via INTRAVENOUS

## 2021-02-08 MED ORDER — CALCIUM CARBONATE 1250 (500 CA) MG PO TABS
2.0000 | ORAL_TABLET | Freq: Every day | ORAL | Status: DC
  Filled 2021-02-08: qty 2

## 2021-02-08 SURGICAL SUPPLY — 43 items
BASIN GRAD PLASTIC 32OZ STRL (MISCELLANEOUS) ×2 IMPLANT
BLADE SURG 15 STRL LF DISP TIS (BLADE) ×1 IMPLANT
BLADE SURG 15 STRL SS (BLADE) ×1
CLIP VESOCCLUDE SM WIDE 6/CT (CLIP) ×2 IMPLANT
DERMABOND ADVANCED (GAUZE/BANDAGES/DRESSINGS) ×1
DERMABOND ADVANCED .7 DNX12 (GAUZE/BANDAGES/DRESSINGS) ×1 IMPLANT
DRAPE LAPAROTOMY 77X122 PED (DRAPES) ×2 IMPLANT
DRAPE MAG INST 16X20 L/F (DRAPES) ×2 IMPLANT
DRSG TEGADERM 2-3/8X2-3/4 SM (GAUZE/BANDAGES/DRESSINGS) ×2 IMPLANT
ELECT CAUTERY BLADE TIP 2.5 (TIP) ×2
ELECT LARYNGEAL DUAL CHAN (ELECTRODE) ×2 IMPLANT
ELECT NEEDLE 20X.3 GREEN (MISCELLANEOUS) ×2
ELECT REM PT RETURN 9FT ADLT (ELECTROSURGICAL) ×2
ELECTRODE CAUTERY BLDE TIP 2.5 (TIP) ×1 IMPLANT
ELECTRODE NEEDLE 20X.3 GREEN (MISCELLANEOUS) ×1 IMPLANT
ELECTRODE REM PT RTRN 9FT ADLT (ELECTROSURGICAL) ×1 IMPLANT
GAUZE 4X4 16PLY ~~LOC~~+RFID DBL (SPONGE) ×2 IMPLANT
GLOVE SURG SYN 6.5 ES PF (GLOVE) ×2 IMPLANT
GLOVE SURG UNDER LTX SZ7 (GLOVE) ×2 IMPLANT
GOWN STRL REUS W/ TWL LRG LVL3 (GOWN DISPOSABLE) ×2 IMPLANT
GOWN STRL REUS W/TWL LRG LVL3 (GOWN DISPOSABLE) ×2
HEMOSTAT SNOW SURGICEL 2X4 (HEMOSTASIS) ×2 IMPLANT
KIT TURNOVER KIT A (KITS) ×2 IMPLANT
LABEL OR SOLS (LABEL) ×2 IMPLANT
MANIFOLD NEPTUNE II (INSTRUMENTS) ×2 IMPLANT
NS IRRIG 500ML POUR BTL (IV SOLUTION) ×2 IMPLANT
PACK BASIN MINOR ARMC (MISCELLANEOUS) ×2 IMPLANT
PENCIL ELECTRO HAND CTR (MISCELLANEOUS) ×2 IMPLANT
PROBE NEUROSIGN BIPOL (MISCELLANEOUS) ×1 IMPLANT
PROBE NEUROSIGN BIPOLAR (MISCELLANEOUS) ×1
SCRUB EXIDINE 4% CHG 4OZ (MISCELLANEOUS) ×2 IMPLANT
SET WALTER ACTIVATION W/DRAPE (SET/KITS/TRAYS/PACK) IMPLANT
SHEARS HARMONIC 9CM CVD (BLADE) ×2 IMPLANT
SPONGE KITTNER 5P (MISCELLANEOUS) ×2 IMPLANT
STRIP CLOSURE SKIN 1/2X4 (GAUZE/BANDAGES/DRESSINGS) ×2 IMPLANT
SUT MNCRL AB 4-0 PS2 18 (SUTURE) IMPLANT
SUT PROLENE 4 0 PS 2 18 (SUTURE) ×2 IMPLANT
SUT SILK 2 0 (SUTURE)
SUT SILK 2-0 18XBRD TIE 12 (SUTURE) IMPLANT
SUT VIC AB 4-0 RB1 27 (SUTURE) ×1
SUT VIC AB 4-0 RB1 27X BRD (SUTURE) ×1 IMPLANT
SYR BULB IRRIG 60ML STRL (SYRINGE) ×2 IMPLANT
WATER STERILE IRR 500ML POUR (IV SOLUTION) IMPLANT

## 2021-02-08 NOTE — Anesthesia Postprocedure Evaluation (Signed)
Anesthesia Post Note  Patient: Tiffany Mann  Procedure(s) Performed: THYROID LOBECTOMY (Left)  Patient location during evaluation: PACU Anesthesia Type: General Level of consciousness: awake and alert Pain management: pain level controlled Vital Signs Assessment: post-procedure vital signs reviewed and stable Respiratory status: spontaneous breathing, nonlabored ventilation, respiratory function stable and patient connected to nasal cannula oxygen Cardiovascular status: blood pressure returned to baseline and stable Postop Assessment: no apparent nausea or vomiting Anesthetic complications: no   No notable events documented.   Last Vitals:  Vitals:   02/08/21 1045 02/08/21 1100  BP: (!) 121/58 126/60  Pulse: 62 63  Resp: 15 10  Temp: (!) 36.2 C   SpO2: 100% 99%    Last Pain:  Vitals:   02/08/21 1054  TempSrc:   PainSc: Asleep                 Arita Miss

## 2021-02-08 NOTE — Interval H&P Note (Signed)
History and Physical Interval Note:  02/08/2021 8:15 AM  Tiffany Mann  has presented today for surgery, with the diagnosis of suspicious thyroid nodule.  The various methods of treatment have been discussed with the patient and family. After consideration of risks, benefits and other options for treatment, the patient has consented to  Procedure(s): THYROID LOBECTOMY (Left) as a surgical intervention.  The patient's history has been reviewed, patient examined, no change in status, stable for surgery.  I have reviewed the patient's chart and labs.  Questions were answered to the patient's satisfaction.     Fredirick Maudlin

## 2021-02-08 NOTE — Op Note (Signed)
Operative Note  Preoperative Diagnosis:  an indeterminate thyroid biopsy  Postoperative Diagnosis:  an indeterminate thyroid biopsy  Operation:  Left Thyroid Lobectomy and Isthmusectomy  Surgeon: Fredirick Maudlin, MD  Assistant: Ronny Bacon, MD (his assistance was necessary for the technical ability to tie hemostatic knots on divided vessels, as well as to divide tissue with electrocautery or harmonic scalpel device as I exposed the tissues were him while performing the dissection.  No other qualified assistant was available.)  Anesthesia: General endotracheal with nerve monitoring system  Findings: Relatively normal sized left lobe of the thyroid with a dominant nodule.  Both parathyroid glands were identified and preserved in situ.  The recurrent laryngeal nerve was well visualized and gave excellent functional signals throughout the case.  Indications: This is a 75 year old woman who has been undergoing treatment for stage IV lung cancer.  On recent imaging, a thyroid nodule was appreciated.  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  Lobectomy was recommended.  The risk the procedure were discussed with her and she agreed to proceed.  Procedure In Detail: The patient was identified in the preoperative holding area and brought to the operating room where she was placed supine on the OR table.  Bony prominences were padded and bilateral sequential compression devices were placed on the lower extremities.  General endotracheal anesthesia was induced using the nerve monitoring system.  Tube placement was verified with the McGrath laryngoscope.  The grounding lead was placed.  The patient was positioned appropriately for the operation and sterilely prepped and draped in standard fashion.  A timeout was performed confirming the patient's identity, the procedure being performed, her allergies, all necessary  equipment was available, and that maintenance anesthesia was adequate.  A 5 cm transverse incision was made midway between the sternal notch and thyroid cartilage.  This is carried down through the subcutaneous tissues and platysma using electrocautery.  Subplatysmal flaps were elevated and the strap muscles were divided in the median raphae.  We elevated the strap muscles off of the left lobe of the thyroid and retracted them laterally.  The superior pole vessels were sequentially isolated and divided with silk ties and the harmonic scalpel.  The middle thyroid vein was similarly divided.  The superior parathyroid gland was identified, dissected off the capsule of the thyroid, and preserved on a good vascular pedicle.  The thyroid was rotated medially and the tracheoesophageal groove was opened.  The recurrent laryngeal nerve was identified and gave an excellent functional signal.  Medial to the nerve, the trachea was exposed and the inferior pole vessels divided.  The inferior parathyroid gland was preserved in situ.  The nerve was dissected up to its insertion point at the cricopharyngeal muscle.  The ligament of Berry and anterior suspensory ligaments were divided.  The gland was dissected off the trachea and across the midline.  The thyroid tissue was divided with the harmonic scalpel at the right lateral aspect of the isthmus.  It was handed off as a specimen.  The cut edge of the thyroid tissue was cauterized.  The wound bed was irrigated.  Hemostasis was confirmed and tested via Valsalva maneuvers from the anesthesia team.  SNoW and Vistaseal were applied for additional hemostasis.  The strap muscles were closed in the midline with running 4-0 Vicryl.  The platysma was closed with interrupted Vicryl.  The skin was closed with running subcuticular Prolene.  The skin was cleaned.  Dermabond  and Steri-Strips were applied.  The Prolene suture was removed.  The patient was awakened, extubated, and taken to the  postanesthesia care unit in good condition.  EBL: Less than 5 cc  IVF: See anesthesia record  Specimen(s): Left thyroid lobe and isthmus to pathology for permanent section  Complications: none immediately apparent.   Counts: all needles, instruments, and sponges were counted and reported to be correct in number at the end of the case.   I was present for and participated in the entire operation.  Fredirick Maudlin 10:29 AM

## 2021-02-08 NOTE — Discharge Instructions (Signed)
AMBULATORY SURGERY  ?DISCHARGE INSTRUCTIONS ? ? ?The drugs that you were given will stay in your system until tomorrow so for the next 24 hours you should not: ? ?Drive an automobile ?Make any legal decisions ?Drink any alcoholic beverage ? ? ?You may resume regular meals tomorrow.  Today it is better to start with liquids and gradually work up to solid foods. ? ?You may eat anything you prefer, but it is better to start with liquids, then soup and crackers, and gradually work up to solid foods. ? ? ?Please notify your doctor immediately if you have any unusual bleeding, trouble breathing, redness and pain at the surgery site, drainage, fever, or pain not relieved by medication. ? ? ? ?Additional Instructions: ? ? ? ?Please contact your physician with any problems or Same Day Surgery at [phone], Monday through Friday 6 am to 4 pm, or [place] at [place] Main number at [phone].  ?

## 2021-02-08 NOTE — Progress Notes (Addendum)
1129-Spoke with patients husband and requested he bring her oral chemo med from home when he comes as the hospital does not have it.  He verbalized understanding.   1145- patient reports that she has her oral chemo in her bag. Pt has about 6 different pills in a compazine bottle. Gave bottle to abby with pharmacy and she will take down and very the oral chemo pill and bring back up.  Per abby other pills will remain in pharmacy until patient is discharged.

## 2021-02-08 NOTE — Anesthesia Procedure Notes (Signed)
Procedure Name: Intubation Date/Time: 02/08/2021 8:39 AM Performed by: Daryel Gerald, RN Pre-anesthesia Checklist: Patient identified, Emergency Drugs available, Suction available and Patient being monitored Patient Re-evaluated:Patient Re-evaluated prior to induction Oxygen Delivery Method: Circle system utilized Preoxygenation: Pre-oxygenation with 100% oxygen Induction Type: IV induction Ventilation: Mask ventilation without difficulty Laryngoscope Size: McGraph and 3 Grade View: Grade I Tube type: Oral (NIMS) Tube size: 7.0 mm Number of attempts: 1 Airway Equipment and Method: Stylet and Oral airway Placement Confirmation: ETT inserted through vocal cords under direct vision, positive ETCO2 and breath sounds checked- equal and bilateral Secured at: 18 cm Tube secured with: Tape Dental Injury: Teeth and Oropharynx as per pre-operative assessment

## 2021-02-08 NOTE — Anesthesia Preprocedure Evaluation (Signed)
Anesthesia Evaluation  Patient identified by MRN, date of birth, ID band Patient awake    Reviewed: Allergy & Precautions, NPO status , Patient's Chart, lab work & pertinent test results  History of Anesthesia Complications Negative for: history of anesthetic complications  Airway Mallampati: II  TM Distance: <3 FB Neck ROM: Full    Dental  (+) Upper Dentures, Lower Dentures   Pulmonary neg pulmonary ROS, neg sleep apnea, neg COPD, Patient abstained from smoking.Not current smoker, former smoker,    Pulmonary exam normal breath sounds clear to auscultation       Cardiovascular Exercise Tolerance: Good METS(-) hypertension(-) CAD and (-) Past MI negative cardio ROS  (-) dysrhythmias  Rhythm:Regular Rate:Normal - Systolic murmurs    Neuro/Psych negative neurological ROS  negative psych ROS   GI/Hepatic neg GERD  ,(+) Cirrhosis     (-) substance abuse  ,   Endo/Other  diabetes, Type 2  Renal/GU negative Renal ROS     Musculoskeletal   Abdominal   Peds  Hematology   Anesthesia Other Findings Past Medical History: No date: [facility])     Comment:  lung No date: Cirrhosis ([place])     Comment:  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 on               CT chest 06/17/20 No date: Diabetes mellitus without complication (HCC) No date: Hypercholesterolemia No date: Snores No date: Thyroid disease No date: Transfusion history No date: Wears dentures  Reproductive/Obstetrics                             Anesthesia Physical Anesthesia Plan  ASA: 2  Anesthesia Plan: General   Post-op Pain Management:    Induction: Intravenous  PONV Risk Score and Plan: 4 or greater and Ondansetron, Dexamethasone and Treatment may vary due to age or medical condition  Airway Management Planned: Oral ETT  Additional Equipment: None  Intra-op Plan:   Post-operative Plan: Extubation in  OR  Informed Consent: I have reviewed the patients History and Physical, chart, labs and discussed the procedure including the risks, benefits and alternatives for the proposed anesthesia with the patient or authorized representative who has indicated his/her understanding and acceptance.     Dental advisory given  Plan Discussed with: CRNA and Surgeon  Anesthesia Plan Comments: (Discussed risks of anesthesia with patient, including PONV, sore throat, lip/dental damage. Rare risks discussed as well, such as cardiorespiratory and neurological sequelae, and allergic reactions. Patient understands.)        Anesthesia Quick Evaluation

## 2021-02-08 NOTE — Transfer of Care (Signed)
Immediate Anesthesia Transfer of Care Note  Patient: Tiffany Mann  Procedure(s) Performed: THYROID LOBECTOMY (Left)  Patient Location: PACU  Anesthesia Type:General  Level of Consciousness: drowsy  Airway & Oxygen Therapy: Patient Spontanous Breathing and Patient connected to face mask oxygen  Post-op Assessment: Report given to RN and Post -op Vital signs reviewed and stable  Post vital signs: Reviewed and stable  Last Vitals:  Vitals Value Taken Time  BP 127/56 02/08/21 1015  Temp 36.4 C 02/08/21 1015  Pulse 66 02/08/21 1019  Resp 12 02/08/21 1019  SpO2 100 % 02/08/21 1019  Vitals shown include unvalidated device data.  Last Pain:  Vitals:   02/08/21 0712  TempSrc: Temporal  PainSc: 0-No pain         Complications: No notable events documented.

## 2021-02-09 ENCOUNTER — Encounter: Payer: Self-pay | Admitting: General Surgery

## 2021-02-09 DIAGNOSIS — E119 Type 2 diabetes mellitus without complications: Secondary | ICD-10-CM | POA: Diagnosis not present

## 2021-02-09 LAB — HEMOGLOBIN A1C
Hgb A1c MFr Bld: 6.9 % — ABNORMAL HIGH (ref 4.8–5.6)
Mean Plasma Glucose: 151 mg/dL

## 2021-02-09 LAB — GLUCOSE, CAPILLARY: Glucose-Capillary: 148 mg/dL — ABNORMAL HIGH (ref 70–99)

## 2021-02-09 MED ORDER — IBUPROFEN 600 MG PO TABS: 600.0000 mg | ORAL_TABLET | Freq: Four times a day (QID) | ORAL | 0 refills | Status: DC | PRN

## 2021-02-09 MED ORDER — TRAMADOL HCL 50 MG PO TABS: 50.0000 mg | ORAL_TABLET | Freq: Four times a day (QID) | ORAL | 0 refills | Status: AC | PRN

## 2021-02-09 NOTE — Discharge Summary (Addendum)
[facility] SURGICAL DISCHARGE SUMMARY  Patient ID: Tiffany Mann MRN: [id number] DOB/AGE: Oct 19, 1945 75 y.o.  Admit date: 02/08/2021 Discharge date: 02/09/2021  Discharge Diagnoses Patient Active Problem List   Diagnosis Date Noted   S/P partial thyroidectomy 02/08/2021    Consultants None  Procedures 02/08/2021: Left Thyroid Lobectomy and Isthmusectomy  HPI: Tiffany Mann is a 75 year old woman who has been undergoing treatment for stage IV lung cancer.  On recent imaging, a thyroid nodule was appreciated.  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  Lobectomy was recommended.  The risk the procedure were discussed with her and she agreed to proceed.  Hospital Course: Informed consent was obtained and documented, and patient underwent uneventful Left Thyroid Lobectomy and Isthmusectomy (Dr Celine Ahr, 02/08/2021). Post-operatively, patient did well. She had some hoarseness in her voice but otherwise did well. Advancement of patient's diet and ambulation were well-tolerated. The remainder of patient's hospital course was essentially unremarkable, and discharge planning was initiated accordingly with patient safely able to be discharged home with appropriate discharge instructions, pain control, and outpatient follow-up after all of her questions were answered to her expressed satisfaction.   Discharge Condition: Good   Physical Examination:  Constitutional: Well appearing female, NAD Pulmonary: Normal effort, some hoarseness to voice, good phonation Skin: Incision to the anterior neck is CDI with dermabond and steri-strips, no erythema   Allergies as of 02/09/2021   No Known Allergies      Medication List     TAKE these medications    Benadryl Itch Stopping 2 % Gel Generic drug: DIPHENHYDRAMINE HCL (TOPICAL) Apply 1 application topically daily as needed  (itching).   calcium carbonate 1250 (500 Ca) MG tablet Commonly known as: OS-CAL - dosed in mg of elemental calcium Take 1 tablet (500 mg of elemental calcium total) by mouth daily. What changed: how much to take   carboxymethylcellulose 0.5 % Soln Commonly known as: REFRESH PLUS Place 1 drop into both eyes 3 (three) times daily as needed (dry/red/irritated eyes).   CINNAMON PO Take 1 tablet by mouth daily.   hydrocortisone cream 1 % Apply 1 application topically daily as needed for itching.   ibuprofen 600 MG tablet Commonly known as: ADVIL Take 1 tablet (600 mg total) by mouth every 6 (six) hours as needed (for mild pain not relieved by other medications.).   linagliptin 5 MG Tabs tablet Commonly known as: TRADJENTA Take 5 mg by mouth daily.   lisinopril 2.5 MG tablet Commonly known as: ZESTRIL Take 2.5 mg by mouth daily.   Magnesium 200 MG Tabs Take 200 mg by mouth daily.   meclizine 25 MG tablet Commonly known as: ANTIVERT Take 25 mg by mouth 3 (three) times daily as needed for dizziness.   metFORMIN 1000 MG tablet Commonly known as: GLUCOPHAGE Take 1,000 mg by mouth 2 (two) times daily with a meal.   multivitamin tablet Take 1 tablet by mouth daily.   ondansetron 8 MG tablet Commonly known as: ZOFRAN Take 1 tablet (8 mg total) by mouth every 8 (eight) hours as needed for nausea or vomiting.   prochlorperazine 10 MG tablet Commonly known as: COMPAZINE Take 1 tablet (10 mg total) by mouth every 6 (six) hours as needed for nausea or vomiting.   Tagrisso 80 MG tablet Generic drug: osimertinib mesylate TAKE 1 TABLET (80 MG TOTAL) BY MOUTH DAILY.   traMADol 50 MG tablet Commonly known as: ULTRAM Take 1  tablet (50 mg total) by mouth every 6 (six) hours as needed for moderate pain or severe pain (mild pain).   TURMERIC CURCUMIN PO Take 1 capsule by mouth daily.   Vitamin D3 125 MCG (5000 UT) Caps Take 5,000 Units by mouth daily.   vitamin E 180 MG (400  UNITS) capsule Take 400 Units by mouth daily.   Zinc 30 MG Tabs Take 30 mg by mouth daily.          Follow-up Information     Fredirick Maudlin, MD. Schedule an appointment as soon as possible for a visit in 2 week(s).   Specialty: General Surgery Why: s/p Left Thyroid Lobectomy and Isthmusectomy Contact information: [address] [place] [address] [phone]                  Time spent on discharge management including discussion of hospital course, clinical condition, outpatient instructions, prescriptions, and follow up with the patient and members of the medical team: >30 minutes  -- Edison Simon , PA-C [facility]  02/09/2021, 9:06 AM [phone] M-F: 7am - 4pm  The patient had departed the facility prior to my evaluation. I discussed her plan of care with Mr. Olean Ree and concur with the above documentation.

## 2021-02-09 NOTE — Plan of Care (Signed)
  Problem: Clinical Measurements: Goal: Cardiovascular complication will be avoided Outcome: Completed/Met   Problem: Activity: Goal: Risk for activity intolerance will decrease Outcome: Completed/Met   Problem: Nutrition: Goal: Adequate nutrition will be maintained Outcome: Completed/Met   Problem: Elimination: Goal: Will not experience complications related to bowel motility Outcome: Completed/Met

## 2021-02-09 NOTE — Progress Notes (Signed)
Pt discharged home with spouse in stable condition. Discharge instructions given. Pt verbalized understanding. Scripts sent to pharmacy of choice. No immediate questions or concerns at this time. Pt wheeled out by volunteer services.

## 2021-02-09 NOTE — Care Management Obs Status (Signed)
[facility] NOTIFICATION   Patient Details  Name: Tiffany Mann MRN: [id number] Date of Birth: [birth year]   Medicare Observation Status Notification Given:  Other (see comment) (Discharged before 24 hours)    Beverly Sessions, RN 02/09/2021, 9:17 AM

## 2021-02-10 LAB — SURGICAL PATHOLOGY

## 2021-02-16 ENCOUNTER — Other Ambulatory Visit (HOSPITAL_COMMUNITY): Payer: Self-pay

## 2021-02-22 ENCOUNTER — Other Ambulatory Visit (HOSPITAL_COMMUNITY): Payer: Self-pay

## 2021-02-23 ENCOUNTER — Encounter: Payer: Medicare Other | Admitting: General Surgery

## 2021-02-23 ENCOUNTER — Other Ambulatory Visit: Payer: Self-pay

## 2021-02-23 ENCOUNTER — Ambulatory Visit (INDEPENDENT_AMBULATORY_CARE_PROVIDER_SITE_OTHER): Payer: Medicare Other | Admitting: Surgery

## 2021-02-23 ENCOUNTER — Encounter: Payer: Self-pay | Admitting: Surgery

## 2021-02-23 VITALS — BP 149/82 | HR 72 | Temp 98.1°F | Ht 64.5 in | Wt 142.8 lb

## 2021-02-23 DIAGNOSIS — C73 Malignant neoplasm of thyroid gland: Secondary | ICD-10-CM | POA: Insufficient documentation

## 2021-02-23 DIAGNOSIS — Z09 Encounter for follow-up examination after completed treatment for conditions other than malignant neoplasm: Secondary | ICD-10-CM

## 2021-02-23 NOTE — Progress Notes (Signed)
[facility] POST-OP OFFICE VISIT  02/23/2021  HPI: Tiffany Mann is a 75 y.o. female 15 days s/p left thyroid lobectomy.  She reports that at times she still has a hoarse voice.  She is currently taking calcium at 1200 mg/day.  Sometimes has problems with swallowing though this is pretty well-tolerated and improving.  She denies any perioral numbness or tingling.  We reviewed Mann pathology: SURGICAL PATHOLOGY  CASE: [id number]  PATIENT: Tiffany Mann  Surgical Pathology Report   Specimen Submitted:  A. Thyroid, left lobe isthmus   Clinical History: Suspicious thyroid nodule.   DIAGNOSIS:  A. 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   Vital signs: BP (!) 149/82   Pulse 72   Temp 98.1 F (36.7 C)   Ht 5' 4.5" (1.638 m)   Wt 142 lb 12.8 oz (64.8 kg)   SpO2 94%   BMI 24.13 kg/m    Physical Exam: Constitutional: She appears well. Neck: Slightly prominent over this surgical site at the base of Mann neck. Skin: Steri-Strips still in place.  No evidence of ecchymosis or erythema or induration.  Assessment/Plan: This is a 75 y.o. female 15 days s/p left thyroid lobectomy by Dr. Celine Ahr.  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[place]) 10/18/2020   Encounter for antineoplastic chemotherapy 08/11/2020   Goals of care, counseling/discussion 07/15/2020   Primary adenocarcinoma of right lung ([place]) 07/15/2020   Thyroid nodule 07/15/2020   Bone metastasis ([place]) 07/15/2020   Right lower lobe lung mass 06/17/2020   Type 2 diabetes mellitus with other specified complication ([place]) 01/65/5374    -Considering Mann other oncological issues, this is of little or no concern and it has resolved without  additional threat to Mann life, or need for additional surgical procedures and I suspect no need for additional intervention.  There may be a role for screening imaging in the future, but I doubt this is of significance at this time.  We will be glad to see this patient back again and anticipate seeing Mann and 6 months or as needed.  She will likely be able to wean off Mann calcium supplementation.   Ronny Bacon M.D., FACS 02/23/2021, 1:27 PM

## 2021-02-23 NOTE — Patient Instructions (Addendum)
Your swelling and voice hoarseness will improve with time. The steri strips will fall off in about 1-2 more weeks.  You may reduce your calcium to 1 tablet daily. Let us know if you start to develop any tingling or numbness in your face or fingers.   May rub Vitamin-E oil or other emmolient agent in area 2-3 times a day to soften. You will need to use sunscreen for the next year on the area to minimize altered pigmentation of the site.  Return to Dr Gabriel Carina for your Thyroid follow up. Call her for a follow up appointment.    Follow-up with our office as needed.  Please call and ask to speak with a nurse if you develop questions or concerns.

## 2021-02-27 ENCOUNTER — Other Ambulatory Visit (HOSPITAL_COMMUNITY): Payer: Self-pay

## 2021-03-09 ENCOUNTER — Encounter: Payer: Self-pay | Admitting: General Surgery

## 2021-03-20 ENCOUNTER — Other Ambulatory Visit: Payer: Self-pay | Admitting: Oncology

## 2021-03-20 ENCOUNTER — Other Ambulatory Visit (HOSPITAL_COMMUNITY): Payer: Self-pay

## 2021-03-20 DIAGNOSIS — C3491 Malignant neoplasm of unspecified part of right bronchus or lung: Secondary | ICD-10-CM

## 2021-03-23 ENCOUNTER — Inpatient Hospital Stay (HOSPITAL_BASED_OUTPATIENT_CLINIC_OR_DEPARTMENT_OTHER): Payer: Medicare Other | Admitting: Oncology

## 2021-03-23 ENCOUNTER — Inpatient Hospital Stay: Payer: Medicare Other | Attending: Oncology

## 2021-03-23 ENCOUNTER — Encounter: Payer: Self-pay | Admitting: Oncology

## 2021-03-23 ENCOUNTER — Other Ambulatory Visit: Payer: Self-pay

## 2021-03-23 VITALS — BP 132/77 | HR 76 | Temp 97.8°F | Wt 142.7 lb

## 2021-03-23 DIAGNOSIS — R590 Localized enlarged lymph nodes: Secondary | ICD-10-CM | POA: Insufficient documentation

## 2021-03-23 DIAGNOSIS — E042 Nontoxic multinodular goiter: Secondary | ICD-10-CM | POA: Insufficient documentation

## 2021-03-23 DIAGNOSIS — L270 Generalized skin eruption due to drugs and medicaments taken internally: Secondary | ICD-10-CM | POA: Insufficient documentation

## 2021-03-23 DIAGNOSIS — D497 Neoplasm of unspecified behavior of endocrine glands and other parts of nervous system: Secondary | ICD-10-CM | POA: Diagnosis not present

## 2021-03-23 DIAGNOSIS — K429 Umbilical hernia without obstruction or gangrene: Secondary | ICD-10-CM | POA: Diagnosis not present

## 2021-03-23 DIAGNOSIS — R0602 Shortness of breath: Secondary | ICD-10-CM | POA: Diagnosis not present

## 2021-03-23 DIAGNOSIS — J90 Pleural effusion, not elsewhere classified: Secondary | ICD-10-CM | POA: Diagnosis not present

## 2021-03-23 DIAGNOSIS — R059 Cough, unspecified: Secondary | ICD-10-CM | POA: Diagnosis not present

## 2021-03-23 DIAGNOSIS — C7951 Secondary malignant neoplasm of bone: Secondary | ICD-10-CM | POA: Insufficient documentation

## 2021-03-23 DIAGNOSIS — C3491 Malignant neoplasm of unspecified part of right bronchus or lung: Secondary | ICD-10-CM | POA: Diagnosis not present

## 2021-03-23 DIAGNOSIS — Z87891 Personal history of nicotine dependence: Secondary | ICD-10-CM | POA: Insufficient documentation

## 2021-03-23 DIAGNOSIS — I700 Atherosclerosis of aorta: Secondary | ICD-10-CM | POA: Insufficient documentation

## 2021-03-23 DIAGNOSIS — Z800 Family history of malignant neoplasm of digestive organs: Secondary | ICD-10-CM | POA: Insufficient documentation

## 2021-03-23 DIAGNOSIS — Z8261 Family history of arthritis: Secondary | ICD-10-CM | POA: Insufficient documentation

## 2021-03-23 DIAGNOSIS — Z833 Family history of diabetes mellitus: Secondary | ICD-10-CM | POA: Diagnosis not present

## 2021-03-23 DIAGNOSIS — Z5111 Encounter for antineoplastic chemotherapy: Secondary | ICD-10-CM | POA: Diagnosis not present

## 2021-03-23 DIAGNOSIS — C3431 Malignant neoplasm of lower lobe, right bronchus or lung: Secondary | ICD-10-CM | POA: Diagnosis not present

## 2021-03-23 LAB — CBC WITH DIFFERENTIAL/PLATELET
Abs Immature Granulocytes: 0.01 10*3/uL (ref 0.00–0.07)
Basophils Absolute: 0 10*3/uL (ref 0.0–0.1)
Basophils Relative: 0 %
Eosinophils Absolute: 0.2 10*3/uL (ref 0.0–0.5)
Eosinophils Relative: 3 %
HCT: 35.9 % — ABNORMAL LOW (ref 36.0–46.0)
Hemoglobin: 12 g/dL (ref 12.0–15.0)
Immature Granulocytes: 0 %
Lymphocytes Relative: 29 %
Lymphs Abs: 1.4 10*3/uL (ref 0.7–4.0)
MCH: 32.6 pg (ref 26.0–34.0)
MCHC: 33.4 g/dL (ref 30.0–36.0)
MCV: 97.6 fL (ref 80.0–100.0)
Monocytes Absolute: 0.4 10*3/uL (ref 0.1–1.0)
Monocytes Relative: 8 %
Neutro Abs: 2.9 10*3/uL (ref 1.7–7.7)
Neutrophils Relative %: 60 %
Platelets: 158 10*3/uL (ref 150–400)
RBC: 3.68 MIL/uL — ABNORMAL LOW (ref 3.87–5.11)
RDW: 13.5 % (ref 11.5–15.5)
WBC: 4.9 10*3/uL (ref 4.0–10.5)
nRBC: 0 % (ref 0.0–0.2)

## 2021-03-23 LAB — COMPREHENSIVE METABOLIC PANEL
ALT: 11 U/L (ref 0–44)
AST: 18 U/L (ref 15–41)
Albumin: 4.5 g/dL (ref 3.5–5.0)
Alkaline Phosphatase: 33 U/L — ABNORMAL LOW (ref 38–126)
Anion gap: 7 (ref 5–15)
BUN: 27 mg/dL — ABNORMAL HIGH (ref 8–23)
CO2: 27 mmol/L (ref 22–32)
Calcium: 9 mg/dL (ref 8.9–10.3)
Chloride: 105 mmol/L (ref 98–111)
Creatinine, Ser: 1.08 mg/dL — ABNORMAL HIGH (ref 0.44–1.00)
GFR, Estimated: 54 mL/min — ABNORMAL LOW (ref >60)
Glucose, Bld: 112 mg/dL — ABNORMAL HIGH (ref 70–99)
Potassium: 4.9 mmol/L (ref 3.5–5.1)
Sodium: 139 mmol/L (ref 135–145)
Total Bilirubin: 0.6 mg/dL (ref 0.3–1.2)
Total Protein: 7.1 g/dL (ref 6.5–8.1)
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
## 2021-03-24 ENCOUNTER — Other Ambulatory Visit (HOSPITAL_COMMUNITY): Payer: Self-pay

## 2021-03-24 ENCOUNTER — Encounter: Payer: Self-pay | Admitting: Oncology

## 2021-03-24 MED ORDER — OSIMERTINIB MESYLATE 80 MG PO TABS
ORAL_TABLET | Freq: Every day | ORAL | 3 refills | Status: DC
  Filled 2021-03-24: qty 30, 30d supply, fill #0
  Filled 2021-04-26: qty 30, 30d supply, fill #1
  Filled 2021-05-30: qty 30, 30d supply, fill #2
  Filled 2021-06-21: qty 30, 30d supply, fill #3

## 2021-04-17 ENCOUNTER — Other Ambulatory Visit: Payer: Self-pay

## 2021-04-17 ENCOUNTER — Ambulatory Visit
Admission: RE | Admit: 2021-04-17 | Discharge: 2021-04-17 | Disposition: A | Discharge status: Home / Self Care | Payer: Medicare Other | Source: Ambulatory Visit | Attending: Oncology | Admitting: Oncology

## 2021-04-17 DIAGNOSIS — C3491 Malignant neoplasm of unspecified part of right bronchus or lung: Secondary | ICD-10-CM | POA: Diagnosis present

## 2021-04-17 LAB — GLUCOSE, CAPILLARY: Glucose-Capillary: 92 mg/dL (ref 70–99)

## 2021-04-17 IMAGING — PT NM PET TUM IMG RESTAG (PS) SKULL BASE T - THIGH
1 series · 1 of 1 positions shown · non-contrast
Comparison: PET-CT 07/12/2020, CT 01/18/2021

CLINICAL DATA: Subsequent treatment strategy for lung carcinoma.
RIGHT lung carcinoma diagnosed January 2021.

EXAM:
NUCLEAR MEDICINE PET SKULL BASE TO THIGH
TECHNIQUE: 7.8 mCi F-18 FDG was injected intravenously. Full-ring PET imaging
was performed from the skull base to thigh after the radiotracer. CT
data was obtained and used for attenuation correction and anatomic
localization.
Fasting blood glucose: 90 mg/dl

[Series 8021: results mm oncology reading · 1.0mm · 1.25mm/px · 1 of 1 slices shown]
[im 1/1]
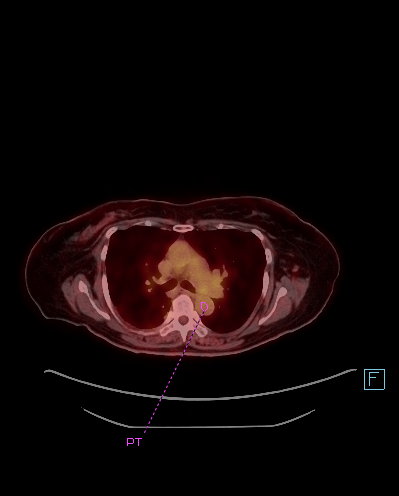

[1 of 1 positions shown; findings below may reference images not displayed]

FINDINGS: Mediastinal blood pool activity: SUV max

Liver activity: SUV max NA

NECK: No hypermetabolic lymph nodes in the neck.

Incidental CT findings: none

CHEST: Resolution of the hypermetabolic mass in the RIGHT lower
lobe. No hypermetabolic tissue remains. No measurable nodularity.

Elsewhere no suspicious pulmonary nodules.

No hypermetabolic mediastinal lymph nodes or hilar lymph nodes.

Incidental CT findings: Coronary artery calcification and aortic
atherosclerotic calcification.

ABDOMEN/PELVIS: No abnormal hypermetabolic activity within the
liver, pancreas, adrenal glands, or spleen. No hypermetabolic lymph
nodes in the abdomen or pelvis.

Physiologic activity throughout the colon.

Incidental CT findings: Normal adrenal glands.

SKELETON: Interval resolution of the hypermetabolic lesion
previously identified in the RIGHT scapula. No new skeletal lesions.

Incidental CT findings: none
IMPRESSION: 1. No evidence of hypermetabolic primary or metastatic lung cancer
on FDG PET scan.
2. Resolution of hypermetabolic mass in the RIGHT lower lobe.
3. Resolution of metabolic activity associated with previously
identified RIGHT scapular lesion.

## 2021-04-17 MED ORDER — FLUDEOXYGLUCOSE F - 18 (FDG) INJECTION
7.4000 | Freq: Once | INTRAVENOUS | Status: AC | PRN
  Administered 2021-04-17: 14:00:00 7.85 via INTRAVENOUS

## 2021-04-20 ENCOUNTER — Other Ambulatory Visit (HOSPITAL_COMMUNITY): Payer: Self-pay

## 2021-04-20 ENCOUNTER — Other Ambulatory Visit: Payer: Medicare Other

## 2021-04-20 ENCOUNTER — Ambulatory Visit: Payer: Medicare Other | Admitting: Oncology

## 2021-04-26 ENCOUNTER — Other Ambulatory Visit (HOSPITAL_COMMUNITY): Payer: Self-pay

## 2021-04-27 ENCOUNTER — Other Ambulatory Visit (HOSPITAL_COMMUNITY): Payer: Self-pay

## 2021-05-02 ENCOUNTER — Ambulatory Visit: Payer: Medicare Other

## 2021-05-02 ENCOUNTER — Other Ambulatory Visit: Payer: Self-pay

## 2021-05-02 ENCOUNTER — Encounter: Payer: Self-pay | Admitting: Oncology

## 2021-05-02 ENCOUNTER — Inpatient Hospital Stay (HOSPITAL_BASED_OUTPATIENT_CLINIC_OR_DEPARTMENT_OTHER): Payer: Medicare Other | Admitting: Oncology

## 2021-05-02 ENCOUNTER — Inpatient Hospital Stay: Payer: Medicare Other

## 2021-05-02 ENCOUNTER — Inpatient Hospital Stay: Payer: Medicare Other | Attending: Oncology

## 2021-05-02 VITALS — BP 132/59 | HR 78 | Temp 96.7°F | Resp 18 | Wt 137.3 lb

## 2021-05-02 DIAGNOSIS — T451X5A Adverse effect of antineoplastic and immunosuppressive drugs, initial encounter: Secondary | ICD-10-CM | POA: Diagnosis not present

## 2021-05-02 DIAGNOSIS — D497 Neoplasm of unspecified behavior of endocrine glands and other parts of nervous system: Secondary | ICD-10-CM | POA: Diagnosis not present

## 2021-05-02 DIAGNOSIS — R059 Cough, unspecified: Secondary | ICD-10-CM | POA: Diagnosis not present

## 2021-05-02 DIAGNOSIS — E041 Nontoxic single thyroid nodule: Secondary | ICD-10-CM | POA: Diagnosis not present

## 2021-05-02 DIAGNOSIS — C3491 Malignant neoplasm of unspecified part of right bronchus or lung: Secondary | ICD-10-CM

## 2021-05-02 DIAGNOSIS — Z5111 Encounter for antineoplastic chemotherapy: Secondary | ICD-10-CM

## 2021-05-02 DIAGNOSIS — Z800 Family history of malignant neoplasm of digestive organs: Secondary | ICD-10-CM | POA: Insufficient documentation

## 2021-05-02 DIAGNOSIS — I700 Atherosclerosis of aorta: Secondary | ICD-10-CM | POA: Insufficient documentation

## 2021-05-02 DIAGNOSIS — E119 Type 2 diabetes mellitus without complications: Secondary | ICD-10-CM | POA: Insufficient documentation

## 2021-05-02 DIAGNOSIS — Z79899 Other long term (current) drug therapy: Secondary | ICD-10-CM | POA: Diagnosis not present

## 2021-05-02 DIAGNOSIS — Z8261 Family history of arthritis: Secondary | ICD-10-CM | POA: Diagnosis not present

## 2021-05-02 DIAGNOSIS — C7951 Secondary malignant neoplasm of bone: Secondary | ICD-10-CM | POA: Diagnosis not present

## 2021-05-02 DIAGNOSIS — Z87891 Personal history of nicotine dependence: Secondary | ICD-10-CM | POA: Diagnosis not present

## 2021-05-02 DIAGNOSIS — C3431 Malignant neoplasm of lower lobe, right bronchus or lung: Secondary | ICD-10-CM | POA: Diagnosis present

## 2021-05-02 DIAGNOSIS — Z833 Family history of diabetes mellitus: Secondary | ICD-10-CM | POA: Diagnosis not present

## 2021-05-02 DIAGNOSIS — J90 Pleural effusion, not elsewhere classified: Secondary | ICD-10-CM | POA: Insufficient documentation

## 2021-05-02 DIAGNOSIS — K521 Toxic gastroenteritis and colitis: Secondary | ICD-10-CM

## 2021-05-02 LAB — CBC WITH DIFFERENTIAL/PLATELET
Abs Immature Granulocytes: 0.01 10*3/uL (ref 0.00–0.07)
Basophils Absolute: 0 10*3/uL (ref 0.0–0.1)
Basophils Relative: 1 %
Eosinophils Absolute: 0.2 10*3/uL (ref 0.0–0.5)
Eosinophils Relative: 4 %
HCT: 35.1 % — ABNORMAL LOW (ref 36.0–46.0)
Hemoglobin: 12.1 g/dL (ref 12.0–15.0)
Immature Granulocytes: 0 %
Lymphocytes Relative: 31 %
Lymphs Abs: 1.4 10*3/uL (ref 0.7–4.0)
MCH: 32.9 pg (ref 26.0–34.0)
MCHC: 34.5 g/dL (ref 30.0–36.0)
MCV: 95.4 fL (ref 80.0–100.0)
Monocytes Absolute: 0.4 10*3/uL (ref 0.1–1.0)
Monocytes Relative: 9 %
Neutro Abs: 2.5 10*3/uL (ref 1.7–7.7)
Neutrophils Relative %: 55 %
Platelets: 205 10*3/uL (ref 150–400)
RBC: 3.68 MIL/uL — ABNORMAL LOW (ref 3.87–5.11)
RDW: 13.6 % (ref 11.5–15.5)
WBC: 4.5 10*3/uL (ref 4.0–10.5)
nRBC: 0 % (ref 0.0–0.2)

## 2021-05-02 LAB — COMPREHENSIVE METABOLIC PANEL
ALT: 15 U/L (ref 0–44)
AST: 19 U/L (ref 15–41)
Albumin: 4.4 g/dL (ref 3.5–5.0)
Alkaline Phosphatase: 37 U/L — ABNORMAL LOW (ref 38–126)
Anion gap: 10 (ref 5–15)
BUN: 29 mg/dL — ABNORMAL HIGH (ref 8–23)
CO2: 26 mmol/L (ref 22–32)
Calcium: 9.3 mg/dL (ref 8.9–10.3)
Chloride: 101 mmol/L (ref 98–111)
Creatinine, Ser: 1.03 mg/dL — ABNORMAL HIGH (ref 0.44–1.00)
GFR, Estimated: 57 mL/min — ABNORMAL LOW (ref >60)
Glucose, Bld: 128 mg/dL — ABNORMAL HIGH (ref 70–99)
Potassium: 4 mmol/L (ref 3.5–5.1)
Sodium: 137 mmol/L (ref 135–145)
Total Bilirubin: 0.2 mg/dL — ABNORMAL LOW (ref 0.3–1.2)
Total Protein: 7.4 g/dL (ref 6.5–8.1)

## 2021-05-02 MED ORDER — DENOSUMAB 120 MG/1.7ML ~~LOC~~ SOLN: 120.0000 mg | Freq: Once | SUBCUTANEOUS | Status: DC

## 2021-05-02 MED ORDER — DENOSUMAB 120 MG/1.7ML ~~LOC~~ SOLN
120.0000 mg | Freq: Once | SUBCUTANEOUS | Status: AC
  Administered 2021-05-02: 120 mg via SUBCUTANEOUS
  Filled 2021-05-02: qty 1.7

## 2021-05-02 NOTE — Progress Notes (Signed)
Patient here for follow up. Pt reports that appetite is poor, she had been having nausea and diarrhea. She has had a 5 pound weight loss since last visit. Also complains of soreness under fingernails.
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
## 2021-05-18 ENCOUNTER — Other Ambulatory Visit (HOSPITAL_COMMUNITY): Payer: Self-pay

## 2021-05-22 ENCOUNTER — Other Ambulatory Visit (HOSPITAL_COMMUNITY): Payer: Self-pay

## 2021-05-30 ENCOUNTER — Other Ambulatory Visit (HOSPITAL_COMMUNITY): Payer: Self-pay

## 2021-06-01 ENCOUNTER — Other Ambulatory Visit (HOSPITAL_COMMUNITY): Payer: Self-pay

## 2021-06-09 ENCOUNTER — Encounter: Payer: Self-pay | Admitting: Oncology

## 2021-06-09 ENCOUNTER — Inpatient Hospital Stay (HOSPITAL_BASED_OUTPATIENT_CLINIC_OR_DEPARTMENT_OTHER): Payer: Medicare Other | Admitting: Oncology

## 2021-06-09 ENCOUNTER — Ambulatory Visit
Admission: RE | Admit: 2021-06-09 | Discharge: 2021-06-09 | Disposition: A | Discharge status: Home / Self Care | Payer: Medicare Other | Source: Ambulatory Visit | Attending: Oncology | Admitting: Oncology

## 2021-06-09 ENCOUNTER — Inpatient Hospital Stay: Payer: Medicare Other | Attending: Oncology

## 2021-06-09 ENCOUNTER — Telehealth: Payer: Self-pay | Admitting: *Deleted

## 2021-06-09 ENCOUNTER — Other Ambulatory Visit: Payer: Self-pay

## 2021-06-09 VITALS — BP 128/83 | HR 90 | Temp 97.7°F | Wt 139.0 lb

## 2021-06-09 DIAGNOSIS — R5383 Other fatigue: Secondary | ICD-10-CM

## 2021-06-09 DIAGNOSIS — C3491 Malignant neoplasm of unspecified part of right bronchus or lung: Secondary | ICD-10-CM | POA: Diagnosis not present

## 2021-06-09 DIAGNOSIS — Z5111 Encounter for antineoplastic chemotherapy: Secondary | ICD-10-CM

## 2021-06-09 DIAGNOSIS — D497 Neoplasm of unspecified behavior of endocrine glands and other parts of nervous system: Secondary | ICD-10-CM

## 2021-06-09 DIAGNOSIS — R0602 Shortness of breath: Secondary | ICD-10-CM | POA: Insufficient documentation

## 2021-06-09 DIAGNOSIS — J029 Acute pharyngitis, unspecified: Secondary | ICD-10-CM | POA: Insufficient documentation

## 2021-06-09 DIAGNOSIS — I700 Atherosclerosis of aorta: Secondary | ICD-10-CM | POA: Insufficient documentation

## 2021-06-09 DIAGNOSIS — Z79899 Other long term (current) drug therapy: Secondary | ICD-10-CM | POA: Diagnosis not present

## 2021-06-09 DIAGNOSIS — Z8261 Family history of arthritis: Secondary | ICD-10-CM | POA: Insufficient documentation

## 2021-06-09 DIAGNOSIS — R051 Acute cough: Secondary | ICD-10-CM | POA: Insufficient documentation

## 2021-06-09 DIAGNOSIS — C771 Secondary and unspecified malignant neoplasm of intrathoracic lymph nodes: Secondary | ICD-10-CM | POA: Insufficient documentation

## 2021-06-09 DIAGNOSIS — D34 Benign neoplasm of thyroid gland: Secondary | ICD-10-CM | POA: Insufficient documentation

## 2021-06-09 DIAGNOSIS — R059 Cough, unspecified: Secondary | ICD-10-CM | POA: Insufficient documentation

## 2021-06-09 DIAGNOSIS — C7951 Secondary malignant neoplasm of bone: Secondary | ICD-10-CM

## 2021-06-09 DIAGNOSIS — Z800 Family history of malignant neoplasm of digestive organs: Secondary | ICD-10-CM | POA: Diagnosis not present

## 2021-06-09 DIAGNOSIS — C3431 Malignant neoplasm of lower lobe, right bronchus or lung: Secondary | ICD-10-CM | POA: Diagnosis not present

## 2021-06-09 DIAGNOSIS — Z833 Family history of diabetes mellitus: Secondary | ICD-10-CM | POA: Diagnosis not present

## 2021-06-09 DIAGNOSIS — Z87891 Personal history of nicotine dependence: Secondary | ICD-10-CM | POA: Diagnosis not present

## 2021-06-09 LAB — CBC WITH DIFFERENTIAL/PLATELET
Abs Immature Granulocytes: 0.01 10*3/uL (ref 0.00–0.07)
Basophils Absolute: 0 10*3/uL (ref 0.0–0.1)
Basophils Relative: 1 %
Eosinophils Absolute: 0.1 10*3/uL (ref 0.0–0.5)
Eosinophils Relative: 2 %
HCT: 36.8 % (ref 36.0–46.0)
Hemoglobin: 12.4 g/dL (ref 12.0–15.0)
Immature Granulocytes: 0 %
Lymphocytes Relative: 19 %
Lymphs Abs: 1 10*3/uL (ref 0.7–4.0)
MCH: 32.5 pg (ref 26.0–34.0)
MCHC: 33.7 g/dL (ref 30.0–36.0)
MCV: 96.3 fL (ref 80.0–100.0)
Monocytes Absolute: 0.5 10*3/uL (ref 0.1–1.0)
Monocytes Relative: 10 %
Neutro Abs: 3.7 10*3/uL (ref 1.7–7.7)
Neutrophils Relative %: 68 %
Platelets: 170 10*3/uL (ref 150–400)
RBC: 3.82 MIL/uL — ABNORMAL LOW (ref 3.87–5.11)
RDW: 13.7 % (ref 11.5–15.5)
WBC: 5.3 10*3/uL (ref 4.0–10.5)
nRBC: 0 % (ref 0.0–0.2)

## 2021-06-09 LAB — COMPREHENSIVE METABOLIC PANEL
ALT: 21 U/L (ref 0–44)
AST: 22 U/L (ref 15–41)
Albumin: 4.4 g/dL (ref 3.5–5.0)
Alkaline Phosphatase: 43 U/L (ref 38–126)
Anion gap: 9 (ref 5–15)
BUN: 22 mg/dL (ref 8–23)
CO2: 26 mmol/L (ref 22–32)
Calcium: 9.3 mg/dL (ref 8.9–10.3)
Chloride: 98 mmol/L (ref 98–111)
Creatinine, Ser: 0.97 mg/dL (ref 0.44–1.00)
GFR, Estimated: >60 mL/min (ref >60)
Glucose, Bld: 144 mg/dL — ABNORMAL HIGH (ref 70–99)
Potassium: 4.2 mmol/L (ref 3.5–5.1)
Sodium: 133 mmol/L — ABNORMAL LOW (ref 135–145)
Total Bilirubin: 0.6 mg/dL (ref 0.3–1.2)
Total Protein: 7.6 g/dL (ref 6.5–8.1)

## 2021-06-09 NOTE — Progress Notes (Signed)
Patient here for acute visit. Patient complains of cough, sore throat, congestion, sores in my mouth and headache. Denies any nausea or vomiting

## 2021-06-09 NOTE — Telephone Encounter (Signed)
Scheduling message sent to schedule labs and add to MD schedule today.
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
## 2021-06-09 NOTE — Telephone Encounter (Signed)
Patient called reporting that she needs to be seen today. She has been sick for a week and was tested for flu and COVID yesterday and was negative for both. She reports that she has nasal congestion with runny nose, headache, cough, red watery eyes, mouth sore and sore throat, and shaking weakness. She is on Tagrisso. Please advise

## 2021-06-13 ENCOUNTER — Inpatient Hospital Stay: Payer: Medicare Other

## 2021-06-13 ENCOUNTER — Inpatient Hospital Stay: Payer: Medicare Other | Admitting: Oncology

## 2021-06-13 ENCOUNTER — Other Ambulatory Visit: Payer: Self-pay

## 2021-06-13 DIAGNOSIS — C3491 Malignant neoplasm of unspecified part of right bronchus or lung: Secondary | ICD-10-CM

## 2021-06-13 DIAGNOSIS — C7951 Secondary malignant neoplasm of bone: Secondary | ICD-10-CM

## 2021-06-13 DIAGNOSIS — C3431 Malignant neoplasm of lower lobe, right bronchus or lung: Secondary | ICD-10-CM | POA: Diagnosis not present

## 2021-06-13 MED ORDER — DENOSUMAB 120 MG/1.7ML ~~LOC~~ SOLN
120.0000 mg | Freq: Once | SUBCUTANEOUS | Status: AC
  Administered 2021-06-13: 15:00:00 120 mg via SUBCUTANEOUS
  Filled 2021-06-13: qty 1.7

## 2021-06-21 ENCOUNTER — Other Ambulatory Visit (HOSPITAL_COMMUNITY): Payer: Self-pay

## 2021-06-29 ENCOUNTER — Other Ambulatory Visit (HOSPITAL_COMMUNITY): Payer: Self-pay

## 2021-07-17 ENCOUNTER — Other Ambulatory Visit (HOSPITAL_COMMUNITY): Payer: Self-pay

## 2021-07-19 ENCOUNTER — Other Ambulatory Visit (HOSPITAL_COMMUNITY): Payer: Self-pay

## 2021-07-19 ENCOUNTER — Other Ambulatory Visit: Payer: Self-pay | Admitting: Oncology

## 2021-07-19 DIAGNOSIS — C3491 Malignant neoplasm of unspecified part of right bronchus or lung: Secondary | ICD-10-CM

## 2021-07-19 NOTE — Telephone Encounter (Signed)
CBC with Differential ?Order: [id number] ?Status: Final result    ?Visible to patient: Yes (seen)    ?Next appt: 07/21/2021 at 11:15 AM in Oncology ([facility])    ?Dx: Primary adenocarcinoma of right lung ...    ?0 Result Notes ?          ?Component Ref Range & Units 1 mo ago 2 mo ago 3 mo ago 6 mo ago 7 mo ago 8 mo ago 9 mo ago  ?WBC 4.0 - 10.5 K/uL 5.3  4.5  4.9  3.7 Low   4.0  4.9  4.5   ?RBC 3.87 - 5.11 MIL/uL 3.82 Low   3.68 Low   3.68 Low   3.79 Low   3.78 Low   3.69 Low   3.66 Low    ?Hemoglobin 12.0 - 15.0 g/dL 12.4  12.1  12.0  12.2  12.0  12.0  11.9 Low    ?HCT 36.0 - 46.0 % 36.8  35.1 Low   35.9 Low   36.5  37.4  35.6 Low   34.8 Low    ?MCV 80.0 - 100.0 fL 96.3  95.4  97.6  96.3  98.9  96.5  95.1   ?MCH 26.0 - 34.0 pg 32.5  32.9  32.6  32.2  31.7  32.5  32.5   ?MCHC 30.0 - 36.0 g/dL 33.7  34.5  33.4  33.4  32.1  33.7  34.2   ?RDW 11.5 - 15.5 % 13.7  13.6  13.5  13.1  13.0  13.6  13.5   ?Platelets 150 - 400 K/uL 170  205  158  168  157  164  181   ?nRBC 0.0 - 0.2 % 0.0  0.0  0.0  0.0  0.0  0.0  0.0   ?Neutrophils Relative % % 68  55  60  53  65  72  53   ?Neutro Abs 1.7 - 7.7 K/uL 3.7  2.5  2.9  2.0  2.6  3.5  2.4   ?Lymphocytes Relative % 19  31  29   32  24  21  32   ?Lymphs Abs 0.7 - 4.0 K/uL 1.0  1.4  1.4  1.2  1.0  1.0  1.4   ?Monocytes Relative % 10  9  8  10  8  6  10    ?Monocytes Absolute 0.1 - 1.0 K/uL 0.5  0.4  0.4  0.4  0.3  0.3  0.5   ?Eosinophils Relative % 2  4  3  4  3  1  4    ?Eosinophils Absolute 0.0 - 0.5 K/uL 0.1  0.2  0.2  0.1  0.1  0.1  0.2   ?Basophils Relative % 1  1  0  1  0  0  1   ?Basophils Absolute 0.0 - 0.1 K/uL 0.0  0.0  0.0  0.0  0.0  0.0  0.0   ?Immature Granulocytes % 0  0  0  0  0  0  0   ?Abs Immature Granulocytes 0.00 - 0.07 K/uL 0.01  0.01 CM  0.01 CM  0.01 CM  0.01 CM  0.01 CM  0.01 CM   ?Comment: Performed at [facility], [address], [place], [place] [address]  ?Resulting Agency  [facility] [place] [facility] [place] [facility] [place] [facility] [place] [facility] [place] [facility] [place]  [facility]  ?  ? ?  ?  ?Specimen Collected: 06/09/21  11:04 Last Resulted: 06/09/21 11:17  ?  ?  Lab Flowsheet   ? Order Details   ? View Encounter   ? Lab and Collection Details   ? Routing   ? Result History    ?View Encounter Conversation    ?  ?CM=Additional comments    ?  ?Result Care Coordination ? ? ?Patient Communication ? ? Add Comments   Seen Back to Top  ?  ?  ? ?Other Results from 06/09/2021 ? ? Contains abnormal data Comprehensive metabolic panel ?Order: [id number] ?Status: Final result    ?Visible to patient: Yes (seen)    ?Next appt: 07/21/2021 at 11:15 AM in Oncology ([facility])    ?Dx: Primary adenocarcinoma of right lung ...    ?0 Result Notes ?          ?Component Ref Range & Units 1 mo ago 2 mo ago 3 mo ago 6 mo ago 7 mo ago 8 mo ago 9 mo ago  ?Sodium 135 - 145 mmol/L 133 Low   137  139  137  137  135  137   ?Potassium 3.5 - 5.1 mmol/L 4.2  4.0  4.9  4.2  4.2  4.8  4.4   ?Chloride 98 - 111 mmol/L 98  101  105  105  103  98  100   ?CO2 22 - 32 mmol/L 26  26  27  26  24  26  28    ?Glucose, Bld 70 - 99 mg/dL 144 High   128 High  CM  112 High  CM  119 High  CM  146 High  CM  134 High  CM  116 High  CM   ?Comment: Glucose reference range applies only to samples taken after fasting for at least 8 hours.  ?BUN 8 - 23 mg/dL 22  29 High   27 High   19  24 High   18  19   ?Creatinine, Ser 0.44 - 1.00 mg/dL 0.97  1.03 High   1.08 High   0.99  1.03 High   0.87  0.99   ?Calcium 8.9 - 10.3 mg/dL 9.3  9.3  9.0  9.0  9.3  9.2  9.4   ?Total Protein 6.5 - 8.1 g/dL 7.6  7.4  7.1  7.1  7.3  7.3  7.2   ?Albumin 3.5 - 5.0 g/dL 4.4  4.4  4.5  4.3  4.3  4.2  4.4   ?AST 15 - 41 U/L 22  19  18  17  19  20  16    ?ALT 0 - 44 U/L 21  15  11  14  13  15  13    ?Alkaline Phosphatase 38 - 126 U/L 43  37 Low   33 Low   43  39  39  41   ?Total Bilirubin 0.3 - 1.2 mg/dL 0.6  0.2 Low   0.6  0.4  0.4  0.7  0.7   ?GFR, Estimated >60 mL/min >60  57 Low  CM  54 Low  CM  >60 CM  57 Low  CM  >60 CM  >60 CM   ?Comment: (NOTE)  ?Calculated  using the CKD-EPI Creatinine Equation (2021)   ?Anion gap 5 - 15 9  10  CM  7 CM  6 CM  10 CM  11 CM  9 CM   ?Comment: Performed at [facility], [address] [place], [place] [address]  ?Resulting Agency  [facility]  [facility] [place] [facility] [place] [facility] [place] [facility] [place] [facility]  ?  ? ?  ?  ?Specimen Collected: 06/09/21 11:04 Last Resulted: 06/09/21 11:28  ?  ?  ?  ? ?

## 2021-07-20 ENCOUNTER — Other Ambulatory Visit (HOSPITAL_COMMUNITY): Payer: Self-pay

## 2021-07-20 ENCOUNTER — Encounter: Payer: Self-pay | Admitting: Oncology

## 2021-07-20 MED ORDER — OSIMERTINIB MESYLATE 80 MG PO TABS
ORAL_TABLET | Freq: Every day | ORAL | 3 refills | Status: DC
  Filled 2021-07-20: qty 30, 30d supply, fill #0
  Filled 2021-08-09: qty 30, 30d supply, fill #1
  Filled 2021-09-06: qty 30, 30d supply, fill #2
  Filled 2021-10-17: qty 30, 30d supply, fill #3

## 2021-07-21 ENCOUNTER — Inpatient Hospital Stay (HOSPITAL_BASED_OUTPATIENT_CLINIC_OR_DEPARTMENT_OTHER): Payer: Medicare Other | Admitting: Oncology

## 2021-07-21 ENCOUNTER — Encounter: Payer: Self-pay | Admitting: Oncology

## 2021-07-21 ENCOUNTER — Inpatient Hospital Stay: Payer: Medicare Other | Attending: Oncology

## 2021-07-21 ENCOUNTER — Other Ambulatory Visit (HOSPITAL_COMMUNITY): Payer: Self-pay

## 2021-07-21 ENCOUNTER — Other Ambulatory Visit: Payer: Self-pay

## 2021-07-21 ENCOUNTER — Inpatient Hospital Stay: Payer: Medicare Other

## 2021-07-21 VITALS — BP 136/76 | HR 76 | Temp 97.1°F | Wt 135.0 lb

## 2021-07-21 DIAGNOSIS — Z800 Family history of malignant neoplasm of digestive organs: Secondary | ICD-10-CM | POA: Diagnosis not present

## 2021-07-21 DIAGNOSIS — R590 Localized enlarged lymph nodes: Secondary | ICD-10-CM | POA: Insufficient documentation

## 2021-07-21 DIAGNOSIS — T451X5A Adverse effect of antineoplastic and immunosuppressive drugs, initial encounter: Secondary | ICD-10-CM | POA: Insufficient documentation

## 2021-07-21 DIAGNOSIS — J90 Pleural effusion, not elsewhere classified: Secondary | ICD-10-CM | POA: Insufficient documentation

## 2021-07-21 DIAGNOSIS — D497 Neoplasm of unspecified behavior of endocrine glands and other parts of nervous system: Secondary | ICD-10-CM

## 2021-07-21 DIAGNOSIS — R12 Heartburn: Secondary | ICD-10-CM | POA: Diagnosis not present

## 2021-07-21 DIAGNOSIS — C3491 Malignant neoplasm of unspecified part of right bronchus or lung: Secondary | ICD-10-CM

## 2021-07-21 DIAGNOSIS — R6881 Early satiety: Secondary | ICD-10-CM | POA: Insufficient documentation

## 2021-07-21 DIAGNOSIS — Z79899 Other long term (current) drug therapy: Secondary | ICD-10-CM | POA: Insufficient documentation

## 2021-07-21 DIAGNOSIS — R0602 Shortness of breath: Secondary | ICD-10-CM | POA: Insufficient documentation

## 2021-07-21 DIAGNOSIS — C7951 Secondary malignant neoplasm of bone: Secondary | ICD-10-CM | POA: Insufficient documentation

## 2021-07-21 DIAGNOSIS — R5383 Other fatigue: Secondary | ICD-10-CM | POA: Diagnosis not present

## 2021-07-21 DIAGNOSIS — Z833 Family history of diabetes mellitus: Secondary | ICD-10-CM | POA: Insufficient documentation

## 2021-07-21 DIAGNOSIS — R918 Other nonspecific abnormal finding of lung field: Secondary | ICD-10-CM

## 2021-07-21 DIAGNOSIS — M47814 Spondylosis without myelopathy or radiculopathy, thoracic region: Secondary | ICD-10-CM | POA: Insufficient documentation

## 2021-07-21 DIAGNOSIS — Z8261 Family history of arthritis: Secondary | ICD-10-CM | POA: Insufficient documentation

## 2021-07-21 DIAGNOSIS — R059 Cough, unspecified: Secondary | ICD-10-CM | POA: Diagnosis not present

## 2021-07-21 DIAGNOSIS — I700 Atherosclerosis of aorta: Secondary | ICD-10-CM | POA: Diagnosis not present

## 2021-07-21 DIAGNOSIS — Z5111 Encounter for antineoplastic chemotherapy: Secondary | ICD-10-CM

## 2021-07-21 DIAGNOSIS — E119 Type 2 diabetes mellitus without complications: Secondary | ICD-10-CM | POA: Diagnosis not present

## 2021-07-21 DIAGNOSIS — K521 Toxic gastroenteritis and colitis: Secondary | ICD-10-CM | POA: Insufficient documentation

## 2021-07-21 DIAGNOSIS — Z87891 Personal history of nicotine dependence: Secondary | ICD-10-CM | POA: Diagnosis not present

## 2021-07-21 DIAGNOSIS — C3431 Malignant neoplasm of lower lobe, right bronchus or lung: Secondary | ICD-10-CM | POA: Diagnosis not present

## 2021-07-21 DIAGNOSIS — R1310 Dysphagia, unspecified: Secondary | ICD-10-CM | POA: Diagnosis not present

## 2021-07-21 DIAGNOSIS — E041 Nontoxic single thyroid nodule: Secondary | ICD-10-CM | POA: Diagnosis not present

## 2021-07-21 LAB — CBC WITH DIFFERENTIAL/PLATELET
Abs Immature Granulocytes: 0.01 10*3/uL (ref 0.00–0.07)
Basophils Absolute: 0 10*3/uL (ref 0.0–0.1)
Basophils Relative: 1 %
Eosinophils Absolute: 0.1 10*3/uL (ref 0.0–0.5)
Eosinophils Relative: 3 %
HCT: 33.8 % — ABNORMAL LOW (ref 36.0–46.0)
Hemoglobin: 11.6 g/dL — ABNORMAL LOW (ref 12.0–15.0)
Immature Granulocytes: 0 %
Lymphocytes Relative: 32 %
Lymphs Abs: 1.4 10*3/uL (ref 0.7–4.0)
MCH: 32.6 pg (ref 26.0–34.0)
MCHC: 34.3 g/dL (ref 30.0–36.0)
MCV: 94.9 fL (ref 80.0–100.0)
Monocytes Absolute: 0.3 10*3/uL (ref 0.1–1.0)
Monocytes Relative: 7 %
Neutro Abs: 2.5 10*3/uL (ref 1.7–7.7)
Neutrophils Relative %: 57 %
Platelets: 176 10*3/uL (ref 150–400)
RBC: 3.56 MIL/uL — ABNORMAL LOW (ref 3.87–5.11)
RDW: 14.1 % (ref 11.5–15.5)
WBC: 4.4 10*3/uL (ref 4.0–10.5)
nRBC: 0 % (ref 0.0–0.2)

## 2021-07-21 LAB — COMPREHENSIVE METABOLIC PANEL
ALT: 14 U/L (ref 0–44)
AST: 17 U/L (ref 15–41)
Albumin: 4 g/dL (ref 3.5–5.0)
Alkaline Phosphatase: 34 U/L — ABNORMAL LOW (ref 38–126)
Anion gap: 4 — ABNORMAL LOW (ref 5–15)
BUN: 16 mg/dL (ref 8–23)
CO2: 25 mmol/L (ref 22–32)
Calcium: 9 mg/dL (ref 8.9–10.3)
Chloride: 108 mmol/L (ref 98–111)
Creatinine, Ser: 0.93 mg/dL (ref 0.44–1.00)
GFR, Estimated: >60 mL/min (ref >60)
Glucose, Bld: 109 mg/dL — ABNORMAL HIGH (ref 70–99)
Potassium: 4.8 mmol/L (ref 3.5–5.1)
Sodium: 137 mmol/L (ref 135–145)
Total Bilirubin: 0.2 mg/dL — ABNORMAL LOW (ref 0.3–1.2)
Total Protein: 7.1 g/dL (ref 6.5–8.1)

## 2021-07-21 MED ORDER — LOPERAMIDE HCL 2 MG PO CAPS: 2.0000 mg | ORAL_CAPSULE | ORAL | 1 refills | Status: DC

## 2021-07-21 MED ORDER — DENOSUMAB 120 MG/1.7ML ~~LOC~~ SOLN
120.0000 mg | Freq: Once | SUBCUTANEOUS | Status: AC
  Administered 2021-07-21: 120 mg via SUBCUTANEOUS
  Filled 2021-07-21: qty 1.7

## 2021-07-21 NOTE — Progress Notes (Signed)
Patient here for follow up. Patient complains of stomach pain after she eats, decrease appetite, and difficulty swallowing water at times.  ?
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
## 2021-07-22 ENCOUNTER — Encounter: Payer: Self-pay | Admitting: Oncology

## 2021-07-24 ENCOUNTER — Other Ambulatory Visit (HOSPITAL_COMMUNITY): Payer: Self-pay

## 2021-08-08 ENCOUNTER — Other Ambulatory Visit: Payer: Self-pay

## 2021-08-08 ENCOUNTER — Ambulatory Visit (HOSPITAL_COMMUNITY)
Admission: RE | Admit: 2021-08-08 | Discharge: 2021-08-08 | Disposition: A | Discharge status: Home / Self Care | Payer: Medicare Other | Source: Ambulatory Visit | Attending: Oncology | Admitting: Oncology

## 2021-08-08 DIAGNOSIS — Z85830 Personal history of malignant neoplasm of bone: Secondary | ICD-10-CM | POA: Insufficient documentation

## 2021-08-08 DIAGNOSIS — Z85850 Personal history of malignant neoplasm of thyroid: Secondary | ICD-10-CM | POA: Insufficient documentation

## 2021-08-08 DIAGNOSIS — J439 Emphysema, unspecified: Secondary | ICD-10-CM | POA: Diagnosis not present

## 2021-08-08 DIAGNOSIS — J432 Centrilobular emphysema: Secondary | ICD-10-CM | POA: Diagnosis not present

## 2021-08-08 DIAGNOSIS — R12 Heartburn: Secondary | ICD-10-CM | POA: Diagnosis present

## 2021-08-08 DIAGNOSIS — I2510 Atherosclerotic heart disease of native coronary artery without angina pectoris: Secondary | ICD-10-CM | POA: Insufficient documentation

## 2021-08-08 DIAGNOSIS — I700 Atherosclerosis of aorta: Secondary | ICD-10-CM | POA: Insufficient documentation

## 2021-08-08 DIAGNOSIS — Z85118 Personal history of other malignant neoplasm of bronchus and lung: Secondary | ICD-10-CM | POA: Diagnosis not present

## 2021-08-08 DIAGNOSIS — K439 Ventral hernia without obstruction or gangrene: Secondary | ICD-10-CM | POA: Insufficient documentation

## 2021-08-08 DIAGNOSIS — I517 Cardiomegaly: Secondary | ICD-10-CM | POA: Diagnosis not present

## 2021-08-08 DIAGNOSIS — E042 Nontoxic multinodular goiter: Secondary | ICD-10-CM | POA: Insufficient documentation

## 2021-08-08 LAB — GLUCOSE, CAPILLARY: Glucose-Capillary: 117 mg/dL — ABNORMAL HIGH (ref 70–99)

## 2021-08-08 MED ORDER — FLUDEOXYGLUCOSE F - 18 (FDG) INJECTION
6.7000 | Freq: Once | INTRAVENOUS | Status: AC
  Administered 2021-08-08: 6.7 via INTRAVENOUS

## 2021-08-09 ENCOUNTER — Other Ambulatory Visit (HOSPITAL_COMMUNITY): Payer: Self-pay

## 2021-08-15 ENCOUNTER — Other Ambulatory Visit (HOSPITAL_COMMUNITY): Payer: Self-pay

## 2021-08-16 ENCOUNTER — Other Ambulatory Visit (HOSPITAL_COMMUNITY): Payer: Self-pay

## 2021-08-20 ENCOUNTER — Encounter: Payer: Self-pay | Admitting: Oncology

## 2021-09-01 ENCOUNTER — Inpatient Hospital Stay (HOSPITAL_BASED_OUTPATIENT_CLINIC_OR_DEPARTMENT_OTHER): Payer: Medicare Other | Admitting: Oncology

## 2021-09-01 ENCOUNTER — Inpatient Hospital Stay: Payer: Medicare Other

## 2021-09-01 ENCOUNTER — Inpatient Hospital Stay: Payer: Medicare Other | Attending: Oncology

## 2021-09-01 ENCOUNTER — Encounter: Payer: Self-pay | Admitting: Oncology

## 2021-09-01 VITALS — BP 111/60 | HR 73 | Temp 97.1°F | Resp 16 | Wt 136.8 lb

## 2021-09-01 DIAGNOSIS — C3491 Malignant neoplasm of unspecified part of right bronchus or lung: Secondary | ICD-10-CM

## 2021-09-01 DIAGNOSIS — Z800 Family history of malignant neoplasm of digestive organs: Secondary | ICD-10-CM | POA: Insufficient documentation

## 2021-09-01 DIAGNOSIS — R234 Changes in skin texture: Secondary | ICD-10-CM | POA: Diagnosis not present

## 2021-09-01 DIAGNOSIS — Z8261 Family history of arthritis: Secondary | ICD-10-CM | POA: Insufficient documentation

## 2021-09-01 DIAGNOSIS — T451X5A Adverse effect of antineoplastic and immunosuppressive drugs, initial encounter: Secondary | ICD-10-CM | POA: Insufficient documentation

## 2021-09-01 DIAGNOSIS — J432 Centrilobular emphysema: Secondary | ICD-10-CM | POA: Insufficient documentation

## 2021-09-01 DIAGNOSIS — I517 Cardiomegaly: Secondary | ICD-10-CM | POA: Insufficient documentation

## 2021-09-01 DIAGNOSIS — Z79899 Other long term (current) drug therapy: Secondary | ICD-10-CM | POA: Insufficient documentation

## 2021-09-01 DIAGNOSIS — I700 Atherosclerosis of aorta: Secondary | ICD-10-CM | POA: Diagnosis not present

## 2021-09-01 DIAGNOSIS — E119 Type 2 diabetes mellitus without complications: Secondary | ICD-10-CM | POA: Diagnosis not present

## 2021-09-01 DIAGNOSIS — C7951 Secondary malignant neoplasm of bone: Secondary | ICD-10-CM | POA: Diagnosis not present

## 2021-09-01 DIAGNOSIS — M47814 Spondylosis without myelopathy or radiculopathy, thoracic region: Secondary | ICD-10-CM | POA: Insufficient documentation

## 2021-09-01 DIAGNOSIS — E042 Nontoxic multinodular goiter: Secondary | ICD-10-CM | POA: Diagnosis not present

## 2021-09-01 DIAGNOSIS — C73 Malignant neoplasm of thyroid gland: Secondary | ICD-10-CM | POA: Insufficient documentation

## 2021-09-01 DIAGNOSIS — K439 Ventral hernia without obstruction or gangrene: Secondary | ICD-10-CM | POA: Insufficient documentation

## 2021-09-01 DIAGNOSIS — K219 Gastro-esophageal reflux disease without esophagitis: Secondary | ICD-10-CM

## 2021-09-01 DIAGNOSIS — Z5111 Encounter for antineoplastic chemotherapy: Secondary | ICD-10-CM

## 2021-09-01 DIAGNOSIS — Z833 Family history of diabetes mellitus: Secondary | ICD-10-CM | POA: Diagnosis not present

## 2021-09-01 DIAGNOSIS — I2510 Atherosclerotic heart disease of native coronary artery without angina pectoris: Secondary | ICD-10-CM | POA: Insufficient documentation

## 2021-09-01 DIAGNOSIS — Z87891 Personal history of nicotine dependence: Secondary | ICD-10-CM | POA: Diagnosis not present

## 2021-09-01 DIAGNOSIS — J90 Pleural effusion, not elsewhere classified: Secondary | ICD-10-CM | POA: Diagnosis not present

## 2021-09-01 DIAGNOSIS — D497 Neoplasm of unspecified behavior of endocrine glands and other parts of nervous system: Secondary | ICD-10-CM

## 2021-09-01 DIAGNOSIS — C3431 Malignant neoplasm of lower lobe, right bronchus or lung: Secondary | ICD-10-CM | POA: Diagnosis present

## 2021-09-01 LAB — CBC WITH DIFFERENTIAL/PLATELET
Abs Immature Granulocytes: 0.01 10*3/uL (ref 0.00–0.07)
Basophils Absolute: 0 10*3/uL (ref 0.0–0.1)
Basophils Relative: 1 %
Eosinophils Absolute: 0.3 10*3/uL (ref 0.0–0.5)
Eosinophils Relative: 5 %
HCT: 34 % — ABNORMAL LOW (ref 36.0–46.0)
Hemoglobin: 11.3 g/dL — ABNORMAL LOW (ref 12.0–15.0)
Immature Granulocytes: 0 %
Lymphocytes Relative: 33 %
Lymphs Abs: 1.6 10*3/uL (ref 0.7–4.0)
MCH: 32.6 pg (ref 26.0–34.0)
MCHC: 33.2 g/dL (ref 30.0–36.0)
MCV: 98 fL (ref 80.0–100.0)
Monocytes Absolute: 0.4 10*3/uL (ref 0.1–1.0)
Monocytes Relative: 9 %
Neutro Abs: 2.6 10*3/uL (ref 1.7–7.7)
Neutrophils Relative %: 52 %
Platelets: 175 10*3/uL (ref 150–400)
RBC: 3.47 MIL/uL — ABNORMAL LOW (ref 3.87–5.11)
RDW: 13.6 % (ref 11.5–15.5)
WBC: 5 10*3/uL (ref 4.0–10.5)
nRBC: 0 % (ref 0.0–0.2)

## 2021-09-01 LAB — COMPREHENSIVE METABOLIC PANEL
ALT: 13 U/L (ref 0–44)
AST: 17 U/L (ref 15–41)
Albumin: 4.2 g/dL (ref 3.5–5.0)
Alkaline Phosphatase: 34 U/L — ABNORMAL LOW (ref 38–126)
Anion gap: 7 (ref 5–15)
BUN: 21 mg/dL (ref 8–23)
CO2: 23 mmol/L (ref 22–32)
Calcium: 8.6 mg/dL — ABNORMAL LOW (ref 8.9–10.3)
Chloride: 107 mmol/L (ref 98–111)
Creatinine, Ser: 0.98 mg/dL (ref 0.44–1.00)
GFR, Estimated: >60 mL/min (ref >60)
Glucose, Bld: 117 mg/dL — ABNORMAL HIGH (ref 70–99)
Potassium: 4.1 mmol/L (ref 3.5–5.1)
Sodium: 137 mmol/L (ref 135–145)
Total Bilirubin: 0.4 mg/dL (ref 0.3–1.2)
Total Protein: 7.3 g/dL (ref 6.5–8.1)

## 2021-09-01 NOTE — Progress Notes (Signed)
Pt states her scalp, the back of her neck is very itchy all day going on a month. Does not take or use anything to help it. As well as, bumps on her lips also going on a month but has been using oralgel and mouthwash.  ?
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
## 2021-09-03 ENCOUNTER — Other Ambulatory Visit: Payer: Self-pay | Admitting: *Deleted

## 2021-09-04 ENCOUNTER — Encounter: Payer: Self-pay | Admitting: Oncology

## 2021-09-05 ENCOUNTER — Other Ambulatory Visit (HOSPITAL_COMMUNITY): Payer: Self-pay

## 2021-09-06 ENCOUNTER — Other Ambulatory Visit (HOSPITAL_COMMUNITY): Payer: Self-pay

## 2021-09-08 ENCOUNTER — Other Ambulatory Visit (HOSPITAL_COMMUNITY): Payer: Self-pay

## 2021-10-03 ENCOUNTER — Other Ambulatory Visit (HOSPITAL_COMMUNITY): Payer: Self-pay

## 2021-10-05 ENCOUNTER — Other Ambulatory Visit (HOSPITAL_COMMUNITY): Payer: Self-pay

## 2021-10-09 ENCOUNTER — Other Ambulatory Visit (HOSPITAL_COMMUNITY): Payer: Self-pay

## 2021-10-12 ENCOUNTER — Telehealth: Payer: Self-pay | Admitting: Oncology

## 2021-10-12 NOTE — Telephone Encounter (Signed)
Called pt on home and cell phone, Unable to LVM .Marland Kitchen05/26 appt updated to accomodate MD add on.Marland KitchenKJ

## 2021-10-13 ENCOUNTER — Inpatient Hospital Stay (HOSPITAL_BASED_OUTPATIENT_CLINIC_OR_DEPARTMENT_OTHER): Payer: Medicare Other | Admitting: Oncology

## 2021-10-13 ENCOUNTER — Encounter: Payer: Self-pay | Admitting: Oncology

## 2021-10-13 ENCOUNTER — Inpatient Hospital Stay: Payer: Medicare Other | Attending: Oncology

## 2021-10-13 VITALS — BP 131/50 | HR 79 | Temp 98.5°F | Resp 16 | Ht 64.5 in | Wt 138.0 lb

## 2021-10-13 DIAGNOSIS — K219 Gastro-esophageal reflux disease without esophagitis: Secondary | ICD-10-CM

## 2021-10-13 DIAGNOSIS — Z79899 Other long term (current) drug therapy: Secondary | ICD-10-CM | POA: Insufficient documentation

## 2021-10-13 DIAGNOSIS — E042 Nontoxic multinodular goiter: Secondary | ICD-10-CM | POA: Insufficient documentation

## 2021-10-13 DIAGNOSIS — M25473 Effusion, unspecified ankle: Secondary | ICD-10-CM | POA: Insufficient documentation

## 2021-10-13 DIAGNOSIS — J90 Pleural effusion, not elsewhere classified: Secondary | ICD-10-CM | POA: Insufficient documentation

## 2021-10-13 DIAGNOSIS — Z8261 Family history of arthritis: Secondary | ICD-10-CM | POA: Insufficient documentation

## 2021-10-13 DIAGNOSIS — I2510 Atherosclerotic heart disease of native coronary artery without angina pectoris: Secondary | ICD-10-CM | POA: Insufficient documentation

## 2021-10-13 DIAGNOSIS — Z833 Family history of diabetes mellitus: Secondary | ICD-10-CM | POA: Insufficient documentation

## 2021-10-13 DIAGNOSIS — C7951 Secondary malignant neoplasm of bone: Secondary | ICD-10-CM | POA: Insufficient documentation

## 2021-10-13 DIAGNOSIS — E119 Type 2 diabetes mellitus without complications: Secondary | ICD-10-CM | POA: Insufficient documentation

## 2021-10-13 DIAGNOSIS — R590 Localized enlarged lymph nodes: Secondary | ICD-10-CM | POA: Insufficient documentation

## 2021-10-13 DIAGNOSIS — C3491 Malignant neoplasm of unspecified part of right bronchus or lung: Secondary | ICD-10-CM | POA: Diagnosis not present

## 2021-10-13 DIAGNOSIS — R5383 Other fatigue: Secondary | ICD-10-CM | POA: Diagnosis not present

## 2021-10-13 DIAGNOSIS — Z5111 Encounter for antineoplastic chemotherapy: Secondary | ICD-10-CM | POA: Diagnosis not present

## 2021-10-13 DIAGNOSIS — Z800 Family history of malignant neoplasm of digestive organs: Secondary | ICD-10-CM | POA: Diagnosis not present

## 2021-10-13 DIAGNOSIS — I517 Cardiomegaly: Secondary | ICD-10-CM | POA: Diagnosis not present

## 2021-10-13 DIAGNOSIS — I700 Atherosclerosis of aorta: Secondary | ICD-10-CM | POA: Diagnosis not present

## 2021-10-13 DIAGNOSIS — C73 Malignant neoplasm of thyroid gland: Secondary | ICD-10-CM | POA: Diagnosis not present

## 2021-10-13 DIAGNOSIS — R234 Changes in skin texture: Secondary | ICD-10-CM

## 2021-10-13 DIAGNOSIS — C3431 Malignant neoplasm of lower lobe, right bronchus or lung: Secondary | ICD-10-CM | POA: Insufficient documentation

## 2021-10-13 DIAGNOSIS — K439 Ventral hernia without obstruction or gangrene: Secondary | ICD-10-CM | POA: Insufficient documentation

## 2021-10-13 DIAGNOSIS — J432 Centrilobular emphysema: Secondary | ICD-10-CM | POA: Insufficient documentation

## 2021-10-13 DIAGNOSIS — D497 Neoplasm of unspecified behavior of endocrine glands and other parts of nervous system: Secondary | ICD-10-CM

## 2021-10-13 LAB — COMPREHENSIVE METABOLIC PANEL
ALT: 18 U/L (ref 0–44)
AST: 22 U/L (ref 15–41)
Albumin: 4.2 g/dL (ref 3.5–5.0)
Alkaline Phosphatase: 38 U/L (ref 38–126)
Anion gap: 7 (ref 5–15)
BUN: 20 mg/dL (ref 8–23)
CO2: 26 mmol/L (ref 22–32)
Calcium: 9.1 mg/dL (ref 8.9–10.3)
Chloride: 104 mmol/L (ref 98–111)
Creatinine, Ser: 1.08 mg/dL — ABNORMAL HIGH (ref 0.44–1.00)
GFR, Estimated: 54 mL/min — ABNORMAL LOW (ref >60)
Glucose, Bld: 170 mg/dL — ABNORMAL HIGH (ref 70–99)
Potassium: 4.3 mmol/L (ref 3.5–5.1)
Sodium: 137 mmol/L (ref 135–145)
Total Bilirubin: 0.5 mg/dL (ref 0.3–1.2)
Total Protein: 7.3 g/dL (ref 6.5–8.1)

## 2021-10-13 LAB — CBC WITH DIFFERENTIAL/PLATELET
Abs Immature Granulocytes: 0.02 10*3/uL (ref 0.00–0.07)
Basophils Absolute: 0 10*3/uL (ref 0.0–0.1)
Basophils Relative: 1 %
Eosinophils Absolute: 0.2 10*3/uL (ref 0.0–0.5)
Eosinophils Relative: 5 %
HCT: 35.2 % — ABNORMAL LOW (ref 36.0–46.0)
Hemoglobin: 11.7 g/dL — ABNORMAL LOW (ref 12.0–15.0)
Immature Granulocytes: 1 %
Lymphocytes Relative: 27 %
Lymphs Abs: 1.2 10*3/uL (ref 0.7–4.0)
MCH: 32.4 pg (ref 26.0–34.0)
MCHC: 33.2 g/dL (ref 30.0–36.0)
MCV: 97.5 fL (ref 80.0–100.0)
Monocytes Absolute: 0.3 10*3/uL (ref 0.1–1.0)
Monocytes Relative: 8 %
Neutro Abs: 2.6 10*3/uL (ref 1.7–7.7)
Neutrophils Relative %: 58 %
Platelets: 184 10*3/uL (ref 150–400)
RBC: 3.61 MIL/uL — ABNORMAL LOW (ref 3.87–5.11)
RDW: 13.7 % (ref 11.5–15.5)
WBC: 4.4 10*3/uL (ref 4.0–10.5)
nRBC: 0 % (ref 0.0–0.2)

## 2021-10-14 ENCOUNTER — Encounter: Payer: Self-pay | Admitting: Oncology
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
## 2021-10-17 ENCOUNTER — Other Ambulatory Visit (HOSPITAL_COMMUNITY): Payer: Self-pay

## 2021-10-18 ENCOUNTER — Other Ambulatory Visit (HOSPITAL_COMMUNITY): Payer: Self-pay

## 2021-10-31 ENCOUNTER — Telehealth: Payer: Self-pay | Admitting: Oncology

## 2021-10-31 NOTE — Telephone Encounter (Signed)
appt reminder for pt July appts sent via mail

## 2021-11-03 ENCOUNTER — Other Ambulatory Visit: Payer: Self-pay

## 2021-11-10 ENCOUNTER — Other Ambulatory Visit (HOSPITAL_COMMUNITY): Payer: Self-pay

## 2021-11-10 ENCOUNTER — Other Ambulatory Visit: Payer: Self-pay | Admitting: Oncology

## 2021-11-10 DIAGNOSIS — C3491 Malignant neoplasm of unspecified part of right bronchus or lung: Secondary | ICD-10-CM

## 2021-11-11 ENCOUNTER — Encounter: Payer: Self-pay | Admitting: Oncology

## 2021-11-11 MED ORDER — OSIMERTINIB MESYLATE 80 MG PO TABS
ORAL_TABLET | Freq: Every day | ORAL | 3 refills | Status: DC
  Filled 2021-11-14: qty 30, 30d supply, fill #0
  Filled 2021-12-07: qty 30, 30d supply, fill #1
  Filled 2022-01-08: qty 30, 30d supply, fill #2
  Filled 2022-02-12: qty 30, 30d supply, fill #3

## 2021-11-13 ENCOUNTER — Other Ambulatory Visit (HOSPITAL_COMMUNITY): Payer: Self-pay

## 2021-11-14 ENCOUNTER — Other Ambulatory Visit (HOSPITAL_COMMUNITY): Payer: Self-pay

## 2021-11-17 ENCOUNTER — Ambulatory Visit
Admission: RE | Admit: 2021-11-17 | Discharge: 2021-11-17 | Disposition: A | Discharge status: Home / Self Care | Payer: Medicare Other | Source: Ambulatory Visit | Attending: Oncology | Admitting: Oncology

## 2021-11-17 DIAGNOSIS — C3491 Malignant neoplasm of unspecified part of right bronchus or lung: Secondary | ICD-10-CM | POA: Insufficient documentation

## 2021-11-17 MED ORDER — IOHEXOL 300 MG/ML  SOLN
80.0000 mL | Freq: Once | INTRAMUSCULAR | Status: AC | PRN
  Administered 2021-11-17: 80 mL via INTRAVENOUS

## 2021-11-20 ENCOUNTER — Other Ambulatory Visit (HOSPITAL_COMMUNITY): Payer: Self-pay

## 2021-11-24 ENCOUNTER — Other Ambulatory Visit: Payer: Self-pay

## 2021-11-24 ENCOUNTER — Inpatient Hospital Stay: Payer: Medicare Other | Attending: Oncology

## 2021-11-24 ENCOUNTER — Inpatient Hospital Stay: Payer: Medicare Other

## 2021-11-24 ENCOUNTER — Encounter: Payer: Self-pay | Admitting: Oncology

## 2021-11-24 ENCOUNTER — Inpatient Hospital Stay (HOSPITAL_BASED_OUTPATIENT_CLINIC_OR_DEPARTMENT_OTHER): Payer: Medicare Other | Admitting: Oncology

## 2021-11-24 DIAGNOSIS — C7951 Secondary malignant neoplasm of bone: Secondary | ICD-10-CM

## 2021-11-24 DIAGNOSIS — Z8261 Family history of arthritis: Secondary | ICD-10-CM | POA: Insufficient documentation

## 2021-11-24 DIAGNOSIS — C73 Malignant neoplasm of thyroid gland: Secondary | ICD-10-CM

## 2021-11-24 DIAGNOSIS — C3491 Malignant neoplasm of unspecified part of right bronchus or lung: Secondary | ICD-10-CM

## 2021-11-24 DIAGNOSIS — Z800 Family history of malignant neoplasm of digestive organs: Secondary | ICD-10-CM | POA: Diagnosis not present

## 2021-11-24 DIAGNOSIS — Z87891 Personal history of nicotine dependence: Secondary | ICD-10-CM | POA: Insufficient documentation

## 2021-11-24 DIAGNOSIS — Z79899 Other long term (current) drug therapy: Secondary | ICD-10-CM | POA: Insufficient documentation

## 2021-11-24 DIAGNOSIS — E119 Type 2 diabetes mellitus without complications: Secondary | ICD-10-CM | POA: Insufficient documentation

## 2021-11-24 DIAGNOSIS — K429 Umbilical hernia without obstruction or gangrene: Secondary | ICD-10-CM | POA: Diagnosis not present

## 2021-11-24 DIAGNOSIS — C3431 Malignant neoplasm of lower lobe, right bronchus or lung: Secondary | ICD-10-CM | POA: Insufficient documentation

## 2021-11-24 DIAGNOSIS — K219 Gastro-esophageal reflux disease without esophagitis: Secondary | ICD-10-CM | POA: Diagnosis not present

## 2021-11-24 DIAGNOSIS — Z5111 Encounter for antineoplastic chemotherapy: Secondary | ICD-10-CM

## 2021-11-24 DIAGNOSIS — Z833 Family history of diabetes mellitus: Secondary | ICD-10-CM | POA: Insufficient documentation

## 2021-11-24 DIAGNOSIS — I700 Atherosclerosis of aorta: Secondary | ICD-10-CM | POA: Insufficient documentation

## 2021-11-24 LAB — CBC WITH DIFFERENTIAL/PLATELET
Abs Immature Granulocytes: 0.01 10*3/uL (ref 0.00–0.07)
Basophils Absolute: 0 10*3/uL (ref 0.0–0.1)
Basophils Relative: 1 %
Eosinophils Absolute: 0.2 10*3/uL (ref 0.0–0.5)
Eosinophils Relative: 5 %
HCT: 36.5 % (ref 36.0–46.0)
Hemoglobin: 12 g/dL (ref 12.0–15.0)
Immature Granulocytes: 0 %
Lymphocytes Relative: 31 %
Lymphs Abs: 1.3 10*3/uL (ref 0.7–4.0)
MCH: 32.2 pg (ref 26.0–34.0)
MCHC: 32.9 g/dL (ref 30.0–36.0)
MCV: 97.9 fL (ref 80.0–100.0)
Monocytes Absolute: 0.4 10*3/uL (ref 0.1–1.0)
Monocytes Relative: 8 %
Neutro Abs: 2.4 10*3/uL (ref 1.7–7.7)
Neutrophils Relative %: 55 %
Platelets: 163 10*3/uL (ref 150–400)
RBC: 3.73 MIL/uL — ABNORMAL LOW (ref 3.87–5.11)
RDW: 13.2 % (ref 11.5–15.5)
WBC: 4.3 10*3/uL (ref 4.0–10.5)
nRBC: 0 % (ref 0.0–0.2)

## 2021-11-24 LAB — COMPREHENSIVE METABOLIC PANEL
ALT: 15 U/L (ref 0–44)
AST: 20 U/L (ref 15–41)
Albumin: 4.2 g/dL (ref 3.5–5.0)
Alkaline Phosphatase: 36 U/L — ABNORMAL LOW (ref 38–126)
Anion gap: 8 (ref 5–15)
BUN: 24 mg/dL — ABNORMAL HIGH (ref 8–23)
CO2: 27 mmol/L (ref 22–32)
Calcium: 9.3 mg/dL (ref 8.9–10.3)
Chloride: 104 mmol/L (ref 98–111)
Creatinine, Ser: 0.9 mg/dL (ref 0.44–1.00)
GFR, Estimated: >60 mL/min (ref >60)
Glucose, Bld: 106 mg/dL — ABNORMAL HIGH (ref 70–99)
Potassium: 5.8 mmol/L — ABNORMAL HIGH (ref 3.5–5.1)
Sodium: 139 mmol/L (ref 135–145)
Total Bilirubin: 0.4 mg/dL (ref 0.3–1.2)
Total Protein: 7.3 g/dL (ref 6.5–8.1)

## 2021-11-24 LAB — BASIC METABOLIC PANEL
Anion gap: 9 (ref 5–15)
BUN: 23 mg/dL (ref 8–23)
CO2: 25 mmol/L (ref 22–32)
Calcium: 9.2 mg/dL (ref 8.9–10.3)
Chloride: 103 mmol/L (ref 98–111)
Creatinine, Ser: 0.94 mg/dL (ref 0.44–1.00)
GFR, Estimated: >60 mL/min (ref >60)
Glucose, Bld: 103 mg/dL — ABNORMAL HIGH (ref 70–99)
Potassium: 4.4 mmol/L (ref 3.5–5.1)
Sodium: 137 mmol/L (ref 135–145)

## 2021-11-24 MED ORDER — DENOSUMAB 120 MG/1.7ML ~~LOC~~ SOLN
120.0000 mg | Freq: Once | SUBCUTANEOUS | Status: AC
  Administered 2021-11-24: 120 mg via SUBCUTANEOUS
  Filled 2021-11-24: qty 1.7

## 2021-11-25 ENCOUNTER — Encounter: Payer: Self-pay | Admitting: Oncology

## 2021-11-25 NOTE — Assessment & Plan Note (Signed)
#  thyroid noninvasive follicular thyroid neoplasm. Given her condition of stage IV lung cancer, it was felt that no need for additional surgery. follow-up with endocrinology.

## 2021-11-25 NOTE — Assessment & Plan Note (Signed)
Continue over-the-counter omeprazole.
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
## 2021-11-25 NOTE — Assessment & Plan Note (Signed)
Treatment plan as listed above.

## 2021-11-25 NOTE — Assessment & Plan Note (Deleted)
#  thyroid noninvasive follicular thyroid neoplasm. Given her condition of stage IV lung cancer, it was felt that no need for additional surgery. follow-up with endocrinology.

## 2021-11-25 NOTE — Assessment & Plan Note (Signed)
#  Stage IV lung adenocarcinoma. Liquid biopsy by [employer] showed [id number] del- EGFR 19 deletion.  Labs are reviewed and discussed with patient.  CT images were reviewed and discussed with patient.  Continue osimertinib.

## 2021-11-25 NOTE — Assessment & Plan Note (Signed)
#  Bone metastasis, patient cannot tolerate Zometa.   Labs are reviewed and discussed with patient. Proceed with Xgeva.

## 2021-12-06 ENCOUNTER — Other Ambulatory Visit (HOSPITAL_COMMUNITY): Payer: Self-pay

## 2021-12-07 ENCOUNTER — Other Ambulatory Visit (HOSPITAL_COMMUNITY): Payer: Self-pay

## 2021-12-11 ENCOUNTER — Other Ambulatory Visit: Payer: Self-pay

## 2021-12-19 ENCOUNTER — Other Ambulatory Visit (HOSPITAL_COMMUNITY): Payer: Self-pay

## 2022-01-03 ENCOUNTER — Other Ambulatory Visit: Payer: Self-pay

## 2022-01-08 ENCOUNTER — Other Ambulatory Visit (HOSPITAL_COMMUNITY): Payer: Self-pay

## 2022-01-10 ENCOUNTER — Emergency Department
Admission: EM | Admit: 2022-01-10 | Discharge: 2022-01-10 | Disposition: A | Discharge status: Home / Self Care | Payer: Medicare Other | Attending: Emergency Medicine | Admitting: Emergency Medicine

## 2022-01-10 ENCOUNTER — Encounter: Payer: Self-pay | Admitting: Medical Oncology

## 2022-01-10 ENCOUNTER — Emergency Department: Payer: Medicare Other

## 2022-01-10 ENCOUNTER — Telehealth: Payer: Self-pay | Admitting: *Deleted

## 2022-01-10 ENCOUNTER — Other Ambulatory Visit: Payer: Self-pay

## 2022-01-10 DIAGNOSIS — U071 COVID-19: Secondary | ICD-10-CM | POA: Diagnosis not present

## 2022-01-10 DIAGNOSIS — J029 Acute pharyngitis, unspecified: Secondary | ICD-10-CM | POA: Diagnosis present

## 2022-01-10 LAB — COMPREHENSIVE METABOLIC PANEL
ALT: 12 U/L (ref 0–44)
AST: 17 U/L (ref 15–41)
Albumin: 4.3 g/dL (ref 3.5–5.0)
Alkaline Phosphatase: 43 U/L (ref 38–126)
Anion gap: 6 (ref 5–15)
BUN: 22 mg/dL (ref 8–23)
CO2: 27 mmol/L (ref 22–32)
Calcium: 9.5 mg/dL (ref 8.9–10.3)
Chloride: 107 mmol/L (ref 98–111)
Creatinine, Ser: 0.97 mg/dL (ref 0.44–1.00)
GFR, Estimated: >60 mL/min (ref >60)
Glucose, Bld: 132 mg/dL — ABNORMAL HIGH (ref 70–99)
Potassium: 5 mmol/L (ref 3.5–5.1)
Sodium: 140 mmol/L (ref 135–145)
Total Bilirubin: 0.7 mg/dL (ref 0.3–1.2)
Total Protein: 7.3 g/dL (ref 6.5–8.1)

## 2022-01-10 LAB — CBC WITH DIFFERENTIAL/PLATELET
Abs Immature Granulocytes: 0.01 10*3/uL (ref 0.00–0.07)
Basophils Absolute: 0 10*3/uL (ref 0.0–0.1)
Basophils Relative: 1 %
Eosinophils Absolute: 0.1 10*3/uL (ref 0.0–0.5)
Eosinophils Relative: 2 %
HCT: 35.1 % — ABNORMAL LOW (ref 36.0–46.0)
Hemoglobin: 11.5 g/dL — ABNORMAL LOW (ref 12.0–15.0)
Immature Granulocytes: 0 %
Lymphocytes Relative: 7 %
Lymphs Abs: 0.3 10*3/uL — ABNORMAL LOW (ref 0.7–4.0)
MCH: 31.4 pg (ref 26.0–34.0)
MCHC: 32.8 g/dL (ref 30.0–36.0)
MCV: 95.9 fL (ref 80.0–100.0)
Monocytes Absolute: 0.4 10*3/uL (ref 0.1–1.0)
Monocytes Relative: 11 %
Neutro Abs: 3.1 10*3/uL (ref 1.7–7.7)
Neutrophils Relative %: 79 %
Platelets: 167 10*3/uL (ref 150–400)
RBC: 3.66 MIL/uL — ABNORMAL LOW (ref 3.87–5.11)
RDW: 13.2 % (ref 11.5–15.5)
WBC: 3.9 10*3/uL — ABNORMAL LOW (ref 4.0–10.5)
nRBC: 0 % (ref 0.0–0.2)

## 2022-01-10 LAB — RESP PANEL BY RT-PCR (FLU A&B, COVID) ARPGX2
Influenza A by PCR: NEGATIVE
Influenza B by PCR: NEGATIVE
SARS Coronavirus 2 by RT PCR: POSITIVE — AB

## 2022-01-10 MED ORDER — ACETAMINOPHEN 325 MG PO TABS
650.0000 mg | ORAL_TABLET | Freq: Once | ORAL | Status: AC
  Administered 2022-01-10: 650 mg via ORAL
  Filled 2022-01-10: qty 2

## 2022-01-10 NOTE — ED Provider Notes (Signed)
[facility] Provider Note    Event Date/Time   First MD Initiated Contact with Patient 01/10/22 [phone]     (approximate)   History   URI   HPI  Tiffany Mann is a 76 y.o. female who presents today for evaluation of sore throat, cough, body aches since yesterday.  Denies fevers or chills.  She denies chest pain or shortness of breath.  She denies abdominal pain, nausea, vomiting, diarrhea.  No urinary symptoms.  No known sick contacts.       Physical Exam   Triage Vital Signs: ED Triage Vitals [01/10/22 0921]  Enc Vitals Group     BP (!) 143/68     Pulse Rate 93     Resp 17     Temp 99.8 F (37.7 C)     Temp Source Oral     SpO2 97 %     Weight 132 lb (59.9 kg)     Height 5\' 4"  (1.626 m)     Head Circumference      Peak Flow      Pain Score 7     Pain Loc      Pain Edu?      Excl. in [place]?     Most recent vital signs: Vitals:   01/10/22 0921 01/10/22 1135  BP: (!) 143/68 130/64  Pulse: 93 84  Resp: 17 16  Temp: 99.8 F (37.7 C) 100.2 F (37.9 C)  SpO2: 97% 98%    Physical Exam Vitals and nursing note reviewed.  Constitutional:      General: Awake and alert. No acute distress.    Appearance: Normal appearance. The patient is normal weight.  HENT:     Head: Normocephalic and atraumatic.     Mouth: Mucous membranes are moist.  Eyes:     General: PERRL. Normal EOMs        Right eye: No discharge.        Left eye: No discharge.     Conjunctiva/sclera: Conjunctivae normal.  Cardiovascular:     Rate and Rhythm: Normal rate and regular rhythm.     Pulses: Normal pulses.     Heart sounds: Normal heart sounds Pulmonary:     Effort: Pulmonary effort is normal. No respiratory distress.     Breath sounds: Normal breath sounds.  Able to speak easily in complete sentences without respiratory distress Abdominal:     Abdomen is soft. There is no abdominal tenderness. No rebound or guarding. No distention. Musculoskeletal:         General: No swelling. Normal range of motion.     Cervical back: Normal range of motion and neck supple.  Skin:    General: Skin is warm and dry.     Capillary Refill: Capillary refill takes less than 2 seconds.     Findings: No rash.  Neurological:     Mental Status: The patient is awake and alert.      ED Results / Procedures / Treatments   Labs (all labs ordered are listed, but only abnormal results are displayed) Labs Reviewed  RESP PANEL BY RT-PCR (FLU A&B, COVID) ARPGX2 - Abnormal; Notable for the following components:      Result Value   SARS Coronavirus 2 by RT PCR POSITIVE (*)    All other components within normal limits  CBC WITH DIFFERENTIAL/PLATELET - Abnormal; Notable for the following components:   WBC 3.9 (*)    RBC 3.66 (*)    Hemoglobin  11.5 (*)    HCT 35.1 (*)    Lymphs Abs 0.3 (*)    All other components within normal limits  COMPREHENSIVE METABOLIC PANEL - Abnormal; Notable for the following components:   Glucose, Bld 132 (*)    All other components within normal limits     EKG     RADIOLOGY I independently reviewed and interpreted imaging and agree with radiologists findings.     PROCEDURES:  Critical Care performed:   Procedures   MEDICATIONS ORDERED IN ED: Medications  acetaminophen (TYLENOL) tablet 650 mg (650 mg Oral Given 01/10/22 1142)     IMPRESSION / MDM / ASSESSMENT AND PLAN / ED COURSE  I reviewed the triage vital signs and the nursing notes.   Differential diagnosis includes, but is not limited to, COVID-19, influenza, URI, pneumonia, bronchitis.  Patient is awake and alert, hemodynamically stable and afebrile.  She demonstrates no increased work of breathing.  Lungs are clear to auscultation bilaterally.  Labs are overall reassuring, no neutropenia.  Chest x-ray without focal consolidation.  COVID test returned positive.  We discussed these findings.  She declined Paxlovid and she has too many interactions with her home  meds.  No respiratory distress or hypoxia to necessitate admission.  Patient is overall well appearing.  She feels comfortable with discharge home.  We discussed symptomatic management and isolation instructions unless she requires medical attention.  She understands return precautions.  Patient was discharged in stable condition and all questions were answered.   Patient's presentation is most consistent with acute complicated illness / injury requiring diagnostic workup.      FINAL CLINICAL IMPRESSION(S) / ED DIAGNOSES   Final diagnoses:  [id number]     Rx / DC Orders   ED Discharge Orders     None        Note:  This document was prepared using Dragon voice recognition software and may include unintentional dictation errors.   Emeline Gins 01/10/22 1401    Arta Silence, MD 01/10/22 1517

## 2022-01-10 NOTE — Telephone Encounter (Signed)
Please postpone appt on 9/1 for 1-2 weeks to allow recovery. Please inform pt about new appt details.

## 2022-01-10 NOTE — Telephone Encounter (Signed)
Patient called wants to let us know that she has COVID. Per chart, she was in ER today and diagnosed there.

## 2022-01-10 NOTE — Discharge Instructions (Signed)
Your COVID-19 test is positive.  You may continue to take Tylenol/ibuprofen per package instructions to help with your body aches or fever if you develop a fever.  Please return for any new, worsening, or change in symptoms or other concerns including trouble breathing, pain, or any other concerns.  Please remain in isolation unless you require medical attention as we discussed.

## 2022-01-10 NOTE — ED Notes (Signed)
Pt verbalized understanding of discharge instructions. Opportunity for questions provided.  

## 2022-01-10 NOTE — ED Notes (Signed)
Patient transported to X-ray 

## 2022-01-10 NOTE — ED Triage Notes (Signed)
Pt reports yesterday she began having sore throat, cough, and body aches. Pt NAD. Ambulatory to triage without difficulty.

## 2022-01-11 ENCOUNTER — Encounter: Payer: Self-pay | Admitting: Oncology

## 2022-01-11 ENCOUNTER — Other Ambulatory Visit (HOSPITAL_COMMUNITY): Payer: Self-pay

## 2022-01-11 ENCOUNTER — Other Ambulatory Visit: Payer: Self-pay

## 2022-01-11 ENCOUNTER — Telehealth: Payer: Self-pay | Admitting: *Deleted

## 2022-01-11 ENCOUNTER — Inpatient Hospital Stay: Payer: Medicare Other | Attending: Oncology | Admitting: Hospice and Palliative Medicine

## 2022-01-11 DIAGNOSIS — U071 COVID-19: Secondary | ICD-10-CM

## 2022-01-11 MED ORDER — MOLNUPIRAVIR EUA 200MG CAPSULE: 4.0000 | ORAL_CAPSULE | Freq: Two times a day (BID) | ORAL | 0 refills | Status: AC

## 2022-01-11 NOTE — Progress Notes (Signed)
Virtual Visit via Telephone Note  I connected with Tiffany Mann on 01/11/22 at  3:30 PM EDT by telephone and verified that I am speaking with the correct person using two identifiers.  Location: Patient: Home Provider: Home   I discussed the limitations, risks, security and privacy concerns of performing an evaluation and management service by telephone and the availability of in person appointments. I also discussed with the patient that there may be a patient responsible charge related to this service. The patient expressed understanding and agreed to proceed.   History of Present Illness: Tiffany Mann is a 76 year old woman with multiple medical problems including stage IV non-small cell lung cancer on treatment with osimertinib.  Patient was seen in the ED on 01/10/2022 with URI symptoms and tested positive for COVID.  She was offered and declined Paxlovid due to concerns for drug to drug interactions.  Patient requested telephone visit today to discuss COVID treatment.   Observations/Objective: I spoke with patient and her husband by phone.  Patient reports about 48 hours of respiratory symptoms including rhinorrhea, nonproductive cough, fever, and body aches.  Tmax reportedly 102.0 yesterday but patient has been afebrile today.  She has a mild headache but denies sore throat.  She has no shortness of breath or chest pain or wheezing.  Husband checked SPO2, which was 95% on room air.  Patient states that she is interested in receiving antiviral treatment for COVID.  Assessment and Plan: COVID-we will start molnupiravir given concerns for drug to drug interactions with Paxlovid.  Discussed symptomatic management with patient and husband.  Recommended increased fluid and rest.  Discussed quarantining protocols and will need to reschedule next clinic visits.  Husband will monitor SPO2 at home.  ER precautions reviewed in detail.  Follow Up Instructions: As previously scheduled    I discussed the assessment and treatment plan with the patient. The patient was provided an opportunity to ask questions and all were answered. The patient agreed with the plan and demonstrated an understanding of the instructions.   The patient was advised to call back or seek an in-person evaluation if the symptoms worsen or if the condition fails to improve as anticipated.  I provided 10 minutes of non-face-to-face time during this encounter.   Irean Hong, NP

## 2022-01-11 NOTE — Telephone Encounter (Signed)
Patient called stating that her PCP Dr Clide Deutscher told her to call us to see if she can tae Paxlovid

## 2022-01-11 NOTE — Telephone Encounter (Signed)
Pt added and arrived.

## 2022-01-11 NOTE — Telephone Encounter (Signed)
Patient would like to do a virtual Visit, Please schedule appointment and call patient

## 2022-01-16 ENCOUNTER — Ambulatory Visit: Payer: Medicare Other

## 2022-01-16 ENCOUNTER — Other Ambulatory Visit: Payer: Medicare Other

## 2022-01-16 ENCOUNTER — Ambulatory Visit: Payer: Medicare Other | Admitting: Oncology

## 2022-01-19 ENCOUNTER — Telehealth: Payer: Self-pay | Admitting: *Deleted

## 2022-01-19 ENCOUNTER — Other Ambulatory Visit: Payer: Self-pay

## 2022-01-19 ENCOUNTER — Emergency Department: Payer: Medicare Other

## 2022-01-19 ENCOUNTER — Other Ambulatory Visit: Payer: Medicare Other

## 2022-01-19 ENCOUNTER — Encounter: Payer: Self-pay | Admitting: Emergency Medicine

## 2022-01-19 ENCOUNTER — Ambulatory Visit: Payer: Medicare Other

## 2022-01-19 ENCOUNTER — Ambulatory Visit: Payer: Medicare Other | Admitting: Oncology

## 2022-01-19 DIAGNOSIS — E119 Type 2 diabetes mellitus without complications: Secondary | ICD-10-CM | POA: Diagnosis not present

## 2022-01-19 DIAGNOSIS — Z7984 Long term (current) use of oral hypoglycemic drugs: Secondary | ICD-10-CM | POA: Diagnosis not present

## 2022-01-19 DIAGNOSIS — D649 Anemia, unspecified: Secondary | ICD-10-CM | POA: Insufficient documentation

## 2022-01-19 DIAGNOSIS — J1282 Pneumonia due to coronavirus disease 2019: Secondary | ICD-10-CM | POA: Insufficient documentation

## 2022-01-19 DIAGNOSIS — Z79899 Other long term (current) drug therapy: Secondary | ICD-10-CM | POA: Diagnosis not present

## 2022-01-19 DIAGNOSIS — U071 COVID-19: Secondary | ICD-10-CM | POA: Diagnosis not present

## 2022-01-19 DIAGNOSIS — R0602 Shortness of breath: Secondary | ICD-10-CM | POA: Diagnosis present

## 2022-01-19 DIAGNOSIS — Z85118 Personal history of other malignant neoplasm of bronchus and lung: Secondary | ICD-10-CM | POA: Diagnosis not present

## 2022-01-19 LAB — CBC WITH DIFFERENTIAL/PLATELET
Abs Immature Granulocytes: 0.01 10*3/uL (ref 0.00–0.07)
Basophils Absolute: 0 10*3/uL (ref 0.0–0.1)
Basophils Relative: 0 %
Eosinophils Absolute: 0 10*3/uL (ref 0.0–0.5)
Eosinophils Relative: 1 %
HCT: 33.5 % — ABNORMAL LOW (ref 36.0–46.0)
Hemoglobin: 11.2 g/dL — ABNORMAL LOW (ref 12.0–15.0)
Immature Granulocytes: 0 %
Lymphocytes Relative: 20 %
Lymphs Abs: 0.8 10*3/uL (ref 0.7–4.0)
MCH: 31.6 pg (ref 26.0–34.0)
MCHC: 33.4 g/dL (ref 30.0–36.0)
MCV: 94.6 fL (ref 80.0–100.0)
Monocytes Absolute: 0.4 10*3/uL (ref 0.1–1.0)
Monocytes Relative: 9 %
Neutro Abs: 2.9 10*3/uL (ref 1.7–7.7)
Neutrophils Relative %: 70 %
Platelets: 136 10*3/uL — ABNORMAL LOW (ref 150–400)
RBC: 3.54 MIL/uL — ABNORMAL LOW (ref 3.87–5.11)
RDW: 13.8 % (ref 11.5–15.5)
WBC: 4.2 10*3/uL (ref 4.0–10.5)
nRBC: 0 % (ref 0.0–0.2)

## 2022-01-19 LAB — COMPREHENSIVE METABOLIC PANEL
ALT: 13 U/L (ref 0–44)
AST: 19 U/L (ref 15–41)
Albumin: 3.6 g/dL (ref 3.5–5.0)
Alkaline Phosphatase: 57 U/L (ref 38–126)
Anion gap: 8 (ref 5–15)
BUN: 21 mg/dL (ref 8–23)
CO2: 23 mmol/L (ref 22–32)
Calcium: 8.3 mg/dL — ABNORMAL LOW (ref 8.9–10.3)
Chloride: 106 mmol/L (ref 98–111)
Creatinine, Ser: 1.16 mg/dL — ABNORMAL HIGH (ref 0.44–1.00)
GFR, Estimated: 49 mL/min — ABNORMAL LOW (ref >60)
Glucose, Bld: 126 mg/dL — ABNORMAL HIGH (ref 70–99)
Potassium: 4.1 mmol/L (ref 3.5–5.1)
Sodium: 137 mmol/L (ref 135–145)
Total Bilirubin: 0.5 mg/dL (ref 0.3–1.2)
Total Protein: 7.1 g/dL (ref 6.5–8.1)

## 2022-01-19 LAB — TROPONIN I (HIGH SENSITIVITY)
Troponin I (High Sensitivity): 6 ng/L (ref <18)
Troponin I (High Sensitivity): 7 ng/L (ref <18)

## 2022-01-19 NOTE — ED Provider Triage Note (Signed)
  Emergency Medicine Provider Triage Evaluation Note  Tiffany Mann , a 76 y.o.female,  was evaluated in triage.  Pt complains of shortness of breath and body aches.  She states that she was diagnosed with COVID last week and treated with Tylenol/ibuprofen.  However, she states that she is feeling worse since last week.   Review of Systems  Positive: Shortness of breath, myalgias Negative: Denies fever, chest pain, vomiting  Physical Exam  There were no vitals filed for this visit. Gen:   Awake, appears uncomfortable. Resp:  Normal effort  MSK:   Moves extremities without difficulty  Other:    Medical Decision Making  Given the patient's initial medical screening exam, the following diagnostic evaluation has been ordered. The patient will be placed in the appropriate treatment space, once one is available, to complete the evaluation and treatment. I have discussed the plan of care with the patient and I have advised the patient that an ED physician or mid-level practitioner will reevaluate their condition after the test results have been received, as the results may give them additional insight into the type of treatment they may need.    Diagnostics: Labs, EKG, CXR  Treatments: none immediately   Teodoro Spray, [place] 01/19/22 1806

## 2022-01-19 NOTE — Telephone Encounter (Signed)
Mr Donnal Debar stating that patient had COVID last week and she is not feeling well Not eating and having diff breathing and she wants to talk to someone at [facility]. Please return her call

## 2022-01-19 NOTE — ED Triage Notes (Signed)
Patient to ED after test COVID positive 1 week ago. Patient states an overall not feeling well with cough, body aches, headache, and lack of appetite. Patient speaking in full sentences without difficulty and ambulatory to triage.

## 2022-01-19 NOTE — Telephone Encounter (Signed)
Spoke with patient and her husband. Patient is having severe fatigue, dyspnea and headaches. She feels like she is getting worse since her covid diagnosis. Advised pt to go to ER. She states that she will have her husband drive her to the emergency room.

## 2022-01-20 ENCOUNTER — Emergency Department
Admission: EM | Admit: 2022-01-20 | Discharge: 2022-01-20 | Disposition: A | Discharge status: Home / Self Care | Payer: Medicare Other | Attending: Emergency Medicine | Admitting: Emergency Medicine

## 2022-01-20 ENCOUNTER — Other Ambulatory Visit: Payer: Self-pay

## 2022-01-20 DIAGNOSIS — J1282 Pneumonia due to coronavirus disease 2019: Secondary | ICD-10-CM

## 2022-01-20 DIAGNOSIS — U071 COVID-19: Secondary | ICD-10-CM | POA: Diagnosis not present

## 2022-01-20 LAB — BRAIN NATRIURETIC PEPTIDE: B Natriuretic Peptide: 62.8 pg/mL (ref 0.0–100.0)

## 2022-01-20 LAB — D-DIMER, QUANTITATIVE: D-Dimer, Quant: 0.63 ug/mL-FEU — ABNORMAL HIGH (ref 0.00–0.50)

## 2022-01-20 LAB — PROCALCITONIN: Procalcitonin: <0.1 ng/mL

## 2022-01-20 MED ORDER — PREDNISONE 10 MG (21) PO TBPK: ORAL_TABLET | ORAL | 0 refills | Status: DC

## 2022-01-20 MED ORDER — ALBUTEROL SULFATE HFA 108 (90 BASE) MCG/ACT IN AERS
4.0000 | INHALATION_SPRAY | Freq: Once | RESPIRATORY_TRACT | Status: AC
  Administered 2022-01-20: 4 via RESPIRATORY_TRACT
  Filled 2022-01-20: qty 6.7

## 2022-01-20 MED ORDER — GUAIFENESIN 100 MG/5ML PO LIQD
5.0000 mL | Freq: Once | ORAL | Status: AC
  Administered 2022-01-20: 5 mL via ORAL
  Filled 2022-01-20: qty 10

## 2022-01-20 MED ORDER — PREDNISONE 20 MG PO TABS
60.0000 mg | ORAL_TABLET | Freq: Once | ORAL | Status: AC
  Administered 2022-01-20: 60 mg via ORAL
  Filled 2022-01-20: qty 3

## 2022-01-20 NOTE — Discharge Instructions (Signed)
You have been diagnosed with COVID 19.  This is a virus that can cause many different symptoms and can be extremely contagious.   You may be eligible for outpatient antiviral treatments for COVID 19 such as Paxlovid if you are within the first 5 days of symptoms. You do not need antibiotics for COVID 19 since it is a virus.  You may use over the counter medications to help manage your symptoms at home.  You will need to quarantine from others for five days (first day of symptoms is DAY ZERO).  If your symptoms are improving or resolved at the end of this time frame, you may come out of quarantine but will need to wear a well fitted mask when around others for the next 5 days.   The best way to protect yourself and others from [place] 19 and potential long term complications is to be vaccinated and receive boosters as recommended by the [facility] and your primary care provider.  If you develop shortness of breath, blue lips or blue fingertips, vomiting that does not stop, chest pain, confusion, become severely weak or feel you may pass out, please return to the closest emergency department.   You may use your albuterol inhaler 2-4 puffs every 2-4 hours as needed for wheezing and shortness of breath.

## 2022-01-20 NOTE — ED Notes (Signed)
Patient ambulated with pulse oximetry monitor per MD. Patient's oxygen saturation ranged from 96-100% on RA. Patient's heart rate ranged from 95-103 bpm. Patient's respiratory rate increased to 22-30 breaths per minute. Patient placed back in recliner. MD informed.
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
## 2022-01-20 NOTE — ED Notes (Signed)
Reviewed discharge instructions, follow-up care, and prescriptions with patient. Patient verbalized understanding of all information reviewed. Patient stable, with no distress noted at this time.    

## 2022-01-28 ENCOUNTER — Other Ambulatory Visit: Payer: Self-pay | Admitting: Oncology

## 2022-01-28 NOTE — Progress Notes (Signed)
ON PATHWAY REGIMEN - Non-Small Cell Lung  No Change  Continue With Treatment as Ordered.  Original Decision Date/Time: 07/26/2020 22:23     A cycle is every 28 days:     Osimertinib   **Always confirm dose/schedule in your pharmacy ordering system**  Patient Characteristics: Stage IV Metastatic, Nonsquamous, Molecular Analysis Completed, Molecular Alteration Present and Eligible for Molecular Targeted Therapy, Initial Molecular Targeted Therapy, EGFR Mutation - Common (Exon 19 Deletion or Exon 21 L858R Substitution) Therapeutic Status: Stage IV Metastatic Histology: Nonsquamous Cell Broad Molecular Profiling Status: Molecular Analysis Completed Molecular Analysis Results: Alteration Present and Eligible for Molecular Targeted Therapy Molecular Alteration Present: EGFR Mutation - Common (Exon 19 Deletion or Exon 21 L858R Substitution) Molecular Targeted Line of Therapy: Initial Molecular Targeted Therapy Intent of Therapy: Non-Curative / Palliative Intent, Discussed with Patient

## 2022-01-31 ENCOUNTER — Other Ambulatory Visit: Payer: Self-pay

## 2022-01-31 ENCOUNTER — Inpatient Hospital Stay: Payer: Medicare Other

## 2022-01-31 ENCOUNTER — Inpatient Hospital Stay (HOSPITAL_BASED_OUTPATIENT_CLINIC_OR_DEPARTMENT_OTHER): Payer: Medicare Other | Admitting: Oncology

## 2022-01-31 ENCOUNTER — Inpatient Hospital Stay: Payer: Medicare Other | Attending: Oncology

## 2022-01-31 ENCOUNTER — Encounter: Payer: Self-pay | Admitting: Oncology

## 2022-01-31 DIAGNOSIS — C3491 Malignant neoplasm of unspecified part of right bronchus or lung: Secondary | ICD-10-CM

## 2022-01-31 DIAGNOSIS — R197 Diarrhea, unspecified: Secondary | ICD-10-CM | POA: Insufficient documentation

## 2022-01-31 DIAGNOSIS — Z79899 Other long term (current) drug therapy: Secondary | ICD-10-CM | POA: Diagnosis not present

## 2022-01-31 DIAGNOSIS — C73 Malignant neoplasm of thyroid gland: Secondary | ICD-10-CM

## 2022-01-31 DIAGNOSIS — I700 Atherosclerosis of aorta: Secondary | ICD-10-CM | POA: Diagnosis not present

## 2022-01-31 DIAGNOSIS — C7951 Secondary malignant neoplasm of bone: Secondary | ICD-10-CM | POA: Diagnosis not present

## 2022-01-31 DIAGNOSIS — R0602 Shortness of breath: Secondary | ICD-10-CM | POA: Diagnosis not present

## 2022-01-31 DIAGNOSIS — Z833 Family history of diabetes mellitus: Secondary | ICD-10-CM | POA: Insufficient documentation

## 2022-01-31 DIAGNOSIS — Z5111 Encounter for antineoplastic chemotherapy: Secondary | ICD-10-CM | POA: Diagnosis not present

## 2022-01-31 DIAGNOSIS — R059 Cough, unspecified: Secondary | ICD-10-CM | POA: Diagnosis not present

## 2022-01-31 DIAGNOSIS — C3431 Malignant neoplasm of lower lobe, right bronchus or lung: Secondary | ICD-10-CM | POA: Diagnosis present

## 2022-01-31 DIAGNOSIS — Z800 Family history of malignant neoplasm of digestive organs: Secondary | ICD-10-CM | POA: Diagnosis not present

## 2022-01-31 DIAGNOSIS — Z8759 Personal history of other complications of pregnancy, childbirth and the puerperium: Secondary | ICD-10-CM | POA: Insufficient documentation

## 2022-01-31 DIAGNOSIS — Z87891 Personal history of nicotine dependence: Secondary | ICD-10-CM | POA: Insufficient documentation

## 2022-01-31 DIAGNOSIS — Z8261 Family history of arthritis: Secondary | ICD-10-CM | POA: Diagnosis not present

## 2022-01-31 LAB — COMPREHENSIVE METABOLIC PANEL
ALT: 17 U/L (ref 0–44)
AST: 19 U/L (ref 15–41)
Albumin: 4 g/dL (ref 3.5–5.0)
Alkaline Phosphatase: 54 U/L (ref 38–126)
Anion gap: 9 (ref 5–15)
BUN: 21 mg/dL (ref 8–23)
CO2: 25 mmol/L (ref 22–32)
Calcium: 9.5 mg/dL (ref 8.9–10.3)
Chloride: 105 mmol/L (ref 98–111)
Creatinine, Ser: 1.04 mg/dL — ABNORMAL HIGH (ref 0.44–1.00)
GFR, Estimated: 56 mL/min — ABNORMAL LOW (ref >60)
Glucose, Bld: 105 mg/dL — ABNORMAL HIGH (ref 70–99)
Potassium: 4.9 mmol/L (ref 3.5–5.1)
Sodium: 139 mmol/L (ref 135–145)
Total Bilirubin: 0.6 mg/dL (ref 0.3–1.2)
Total Protein: 7.7 g/dL (ref 6.5–8.1)

## 2022-01-31 LAB — CBC WITH DIFFERENTIAL/PLATELET
Abs Immature Granulocytes: 0.03 10*3/uL (ref 0.00–0.07)
Basophils Absolute: 0 10*3/uL (ref 0.0–0.1)
Basophils Relative: 0 %
Eosinophils Absolute: 0.1 10*3/uL (ref 0.0–0.5)
Eosinophils Relative: 2 %
HCT: 37.1 % (ref 36.0–46.0)
Hemoglobin: 12.2 g/dL (ref 12.0–15.0)
Immature Granulocytes: 1 %
Lymphocytes Relative: 20 %
Lymphs Abs: 1.3 10*3/uL (ref 0.7–4.0)
MCH: 32 pg (ref 26.0–34.0)
MCHC: 32.9 g/dL (ref 30.0–36.0)
MCV: 97.4 fL (ref 80.0–100.0)
Monocytes Absolute: 0.6 10*3/uL (ref 0.1–1.0)
Monocytes Relative: 9 %
Neutro Abs: 4.2 10*3/uL (ref 1.7–7.7)
Neutrophils Relative %: 68 %
Platelets: 160 10*3/uL (ref 150–400)
RBC: 3.81 MIL/uL — ABNORMAL LOW (ref 3.87–5.11)
RDW: 13.9 % (ref 11.5–15.5)
WBC: 6.2 10*3/uL (ref 4.0–10.5)
nRBC: 0 % (ref 0.0–0.2)

## 2022-01-31 MED ORDER — DENOSUMAB 120 MG/1.7ML ~~LOC~~ SOLN
120.0000 mg | Freq: Once | SUBCUTANEOUS | Status: AC
  Administered 2022-01-31: 120 mg via SUBCUTANEOUS
  Filled 2022-01-31: qty 1.7

## 2022-01-31 MED ORDER — LOPERAMIDE HCL 2 MG PO CAPS: 2.0000 mg | ORAL_CAPSULE | ORAL | 1 refills | Status: DC
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
## 2022-01-31 NOTE — Assessment & Plan Note (Signed)
#  thyroid noninvasive follicular thyroid neoplasm. Given her condition of stage IV lung cancer, it was felt that no need for additional surgery. follow-up with endocrinology.

## 2022-01-31 NOTE — Assessment & Plan Note (Signed)
Treatment plan as listed above.

## 2022-01-31 NOTE — Progress Notes (Signed)
Pt here for follow up. Pt reports ever since she had covid she has been having pain/ soreness to back and increased shortness of breath

## 2022-01-31 NOTE — Assessment & Plan Note (Addendum)
#  Stage IV lung adenocarcinoma. Liquid biopsy by [employer] showed [id number] del- EGFR 19 deletion.  Labs are reviewed and discussed with patient.  CT images were reviewed and discussed with patient.  Continue osimertinib.   Obtain repeat CT in October 2023

## 2022-01-31 NOTE — Assessment & Plan Note (Signed)
#  Bone metastasis, patient cannot tolerate Zometa.   Labs are reviewed and discussed with patient. Proceed with Xgeva.

## 2022-02-01 ENCOUNTER — Other Ambulatory Visit (HOSPITAL_COMMUNITY): Payer: Self-pay

## 2022-02-01 ENCOUNTER — Other Ambulatory Visit: Payer: Self-pay

## 2022-02-05 ENCOUNTER — Other Ambulatory Visit: Payer: Self-pay

## 2022-02-05 ENCOUNTER — Other Ambulatory Visit (HOSPITAL_COMMUNITY): Payer: Self-pay

## 2022-02-07 ENCOUNTER — Other Ambulatory Visit (HOSPITAL_COMMUNITY): Payer: Self-pay

## 2022-02-12 ENCOUNTER — Other Ambulatory Visit (HOSPITAL_COMMUNITY): Payer: Self-pay

## 2022-02-13 ENCOUNTER — Other Ambulatory Visit (HOSPITAL_COMMUNITY): Payer: Self-pay

## 2022-02-27 ENCOUNTER — Other Ambulatory Visit (HOSPITAL_COMMUNITY): Payer: Self-pay

## 2022-03-01 ENCOUNTER — Other Ambulatory Visit (HOSPITAL_COMMUNITY): Payer: Self-pay

## 2022-03-01 ENCOUNTER — Other Ambulatory Visit: Payer: Self-pay | Admitting: Oncology

## 2022-03-01 DIAGNOSIS — C3491 Malignant neoplasm of unspecified part of right bronchus or lung: Secondary | ICD-10-CM

## 2022-03-02 ENCOUNTER — Encounter: Payer: Self-pay | Admitting: Oncology

## 2022-03-02 ENCOUNTER — Other Ambulatory Visit (HOSPITAL_COMMUNITY): Payer: Self-pay

## 2022-03-02 MED ORDER — OSIMERTINIB MESYLATE 80 MG PO TABS
ORAL_TABLET | Freq: Every day | ORAL | 3 refills | Status: DC
  Filled 2022-03-02: qty 30, 30d supply, fill #0
  Filled 2022-04-03: qty 30, 30d supply, fill #1
  Filled 2022-05-03: qty 30, 30d supply, fill #2
  Filled 2022-05-25: qty 30, 30d supply, fill #3

## 2022-03-07 ENCOUNTER — Ambulatory Visit
Admission: RE | Admit: 2022-03-07 | Discharge: 2022-03-07 | Disposition: A | Discharge status: Home / Self Care | Payer: Medicare Other | Source: Ambulatory Visit | Attending: Oncology | Admitting: Oncology

## 2022-03-07 DIAGNOSIS — C3491 Malignant neoplasm of unspecified part of right bronchus or lung: Secondary | ICD-10-CM | POA: Insufficient documentation

## 2022-03-07 MED ORDER — IOHEXOL 300 MG/ML  SOLN
100.0000 mL | Freq: Once | INTRAMUSCULAR | Status: AC | PRN
Start: 2022-03-07 — End: 2022-03-07
  Administered 2022-03-07: 100 mL via INTRAVENOUS

## 2022-03-08 ENCOUNTER — Other Ambulatory Visit (HOSPITAL_COMMUNITY): Payer: Self-pay

## 2022-03-29 ENCOUNTER — Other Ambulatory Visit: Payer: Self-pay

## 2022-04-02 ENCOUNTER — Other Ambulatory Visit (HOSPITAL_COMMUNITY): Payer: Self-pay

## 2022-04-02 ENCOUNTER — Inpatient Hospital Stay (HOSPITAL_BASED_OUTPATIENT_CLINIC_OR_DEPARTMENT_OTHER): Payer: Medicare Other | Admitting: Oncology

## 2022-04-02 ENCOUNTER — Inpatient Hospital Stay: Payer: Medicare Other | Attending: Oncology

## 2022-04-02 ENCOUNTER — Encounter: Payer: Self-pay | Admitting: Oncology

## 2022-04-02 ENCOUNTER — Other Ambulatory Visit: Payer: Self-pay

## 2022-04-02 ENCOUNTER — Inpatient Hospital Stay: Payer: Medicare Other

## 2022-04-02 VITALS — BP 139/57 | HR 78 | Temp 97.4°F | Resp 18 | Wt 135.8 lb

## 2022-04-02 DIAGNOSIS — K429 Umbilical hernia without obstruction or gangrene: Secondary | ICD-10-CM | POA: Insufficient documentation

## 2022-04-02 DIAGNOSIS — Z8261 Family history of arthritis: Secondary | ICD-10-CM | POA: Insufficient documentation

## 2022-04-02 DIAGNOSIS — D649 Anemia, unspecified: Secondary | ICD-10-CM | POA: Insufficient documentation

## 2022-04-02 DIAGNOSIS — Z87891 Personal history of nicotine dependence: Secondary | ICD-10-CM | POA: Insufficient documentation

## 2022-04-02 DIAGNOSIS — J90 Pleural effusion, not elsewhere classified: Secondary | ICD-10-CM | POA: Diagnosis not present

## 2022-04-02 DIAGNOSIS — N1831 Chronic kidney disease, stage 3a: Secondary | ICD-10-CM | POA: Diagnosis not present

## 2022-04-02 DIAGNOSIS — C73 Malignant neoplasm of thyroid gland: Secondary | ICD-10-CM

## 2022-04-02 DIAGNOSIS — Z79899 Other long term (current) drug therapy: Secondary | ICD-10-CM | POA: Diagnosis not present

## 2022-04-02 DIAGNOSIS — C7951 Secondary malignant neoplasm of bone: Secondary | ICD-10-CM

## 2022-04-02 DIAGNOSIS — Z833 Family history of diabetes mellitus: Secondary | ICD-10-CM | POA: Insufficient documentation

## 2022-04-02 DIAGNOSIS — I2510 Atherosclerotic heart disease of native coronary artery without angina pectoris: Secondary | ICD-10-CM | POA: Diagnosis not present

## 2022-04-02 DIAGNOSIS — N189 Chronic kidney disease, unspecified: Secondary | ICD-10-CM | POA: Insufficient documentation

## 2022-04-02 DIAGNOSIS — Z8759 Personal history of other complications of pregnancy, childbirth and the puerperium: Secondary | ICD-10-CM | POA: Diagnosis not present

## 2022-04-02 DIAGNOSIS — I700 Atherosclerosis of aorta: Secondary | ICD-10-CM | POA: Diagnosis not present

## 2022-04-02 DIAGNOSIS — C3491 Malignant neoplasm of unspecified part of right bronchus or lung: Secondary | ICD-10-CM

## 2022-04-02 DIAGNOSIS — K7460 Unspecified cirrhosis of liver: Secondary | ICD-10-CM | POA: Insufficient documentation

## 2022-04-02 DIAGNOSIS — C3431 Malignant neoplasm of lower lobe, right bronchus or lung: Secondary | ICD-10-CM | POA: Insufficient documentation

## 2022-04-02 DIAGNOSIS — Z5111 Encounter for antineoplastic chemotherapy: Secondary | ICD-10-CM | POA: Diagnosis not present

## 2022-04-02 DIAGNOSIS — Z800 Family history of malignant neoplasm of digestive organs: Secondary | ICD-10-CM | POA: Insufficient documentation

## 2022-04-02 LAB — CBC WITH DIFFERENTIAL/PLATELET
Abs Immature Granulocytes: 0.01 10*3/uL (ref 0.00–0.07)
Basophils Absolute: 0 10*3/uL (ref 0.0–0.1)
Basophils Relative: 0 %
Eosinophils Absolute: 0.2 10*3/uL (ref 0.0–0.5)
Eosinophils Relative: 4 %
HCT: 33.2 % — ABNORMAL LOW (ref 36.0–46.0)
Hemoglobin: 10.8 g/dL — ABNORMAL LOW (ref 12.0–15.0)
Immature Granulocytes: 0 %
Lymphocytes Relative: 29 %
Lymphs Abs: 1.2 10*3/uL (ref 0.7–4.0)
MCH: 31.6 pg (ref 26.0–34.0)
MCHC: 32.5 g/dL (ref 30.0–36.0)
MCV: 97.1 fL (ref 80.0–100.0)
Monocytes Absolute: 0.4 10*3/uL (ref 0.1–1.0)
Monocytes Relative: 10 %
Neutro Abs: 2.3 10*3/uL (ref 1.7–7.7)
Neutrophils Relative %: 57 %
Platelets: 171 10*3/uL (ref 150–400)
RBC: 3.42 MIL/uL — ABNORMAL LOW (ref 3.87–5.11)
RDW: 14 % (ref 11.5–15.5)
WBC: 4.1 10*3/uL (ref 4.0–10.5)
nRBC: 0 % (ref 0.0–0.2)

## 2022-04-02 LAB — COMPREHENSIVE METABOLIC PANEL
ALT: 14 U/L (ref 0–44)
AST: 21 U/L (ref 15–41)
Albumin: 4.2 g/dL (ref 3.5–5.0)
Alkaline Phosphatase: 40 U/L (ref 38–126)
Anion gap: 8 (ref 5–15)
BUN: 31 mg/dL — ABNORMAL HIGH (ref 8–23)
CO2: 26 mmol/L (ref 22–32)
Calcium: 9.4 mg/dL (ref 8.9–10.3)
Chloride: 105 mmol/L (ref 98–111)
Creatinine, Ser: 1.15 mg/dL — ABNORMAL HIGH (ref 0.44–1.00)
GFR, Estimated: 50 mL/min — ABNORMAL LOW (ref >60)
Glucose, Bld: 136 mg/dL — ABNORMAL HIGH (ref 70–99)
Potassium: 4.1 mmol/L (ref 3.5–5.1)
Sodium: 139 mmol/L (ref 135–145)
Total Bilirubin: 0.4 mg/dL (ref 0.3–1.2)
Total Protein: 7.1 g/dL (ref 6.5–8.1)

## 2022-04-02 LAB — IRON AND TIBC
Iron: 70 ug/dL (ref 28–170)
Saturation Ratios: 20 % (ref 10.4–31.8)
TIBC: 351 ug/dL (ref 250–450)
UIBC: 281 ug/dL

## 2022-04-02 LAB — FERRITIN: Ferritin: 189 ng/mL (ref 11–307)

## 2022-04-02 MED ORDER — DENOSUMAB 120 MG/1.7ML ~~LOC~~ SOLN
120.0000 mg | Freq: Once | SUBCUTANEOUS | Status: AC
  Administered 2022-04-02: 120 mg via SUBCUTANEOUS
  Filled 2022-04-02: qty 1.7

## 2022-04-02 NOTE — Assessment & Plan Note (Signed)
#  thyroid noninvasive follicular thyroid neoplasm. Given her condition of stage IV lung cancer, it was felt that no need for additional surgery. follow-up with endocrinology.

## 2022-04-02 NOTE — Assessment & Plan Note (Signed)
Avoid nephrotoxins. Encourage oral hydration.  Elevated Cr could be due to Osimertinib.  Monitor.

## 2022-04-02 NOTE — Assessment & Plan Note (Signed)
Iron panel is stable.  Probably due to chronic kidney insufficiency

## 2022-04-02 NOTE — Assessment & Plan Note (Signed)
Treatment plan as listed above.

## 2022-04-02 NOTE — Assessment & Plan Note (Signed)
#  Stage IV lung adenocarcinoma. Liquid biopsy by [employer] showed [id number] del- EGFR 19 deletion.  Labs are reviewed and discussed with patient.  CT images were reviewed and discussed with patient. -stable disease Continue Osimertinib.

## 2022-04-02 NOTE — Progress Notes (Signed)
Pt here for follow up. Pt reports occasional cramping to left leg.
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
## 2022-04-02 NOTE — Assessment & Plan Note (Signed)
#  Bone metastasis, patient cannot tolerate Zometa.   Labs are reviewed and discussed with patient. Proceed with Xgeva.

## 2022-04-03 ENCOUNTER — Other Ambulatory Visit: Payer: Self-pay

## 2022-04-03 ENCOUNTER — Other Ambulatory Visit (HOSPITAL_COMMUNITY): Payer: Self-pay

## 2022-04-04 ENCOUNTER — Other Ambulatory Visit: Payer: Self-pay

## 2022-04-06 ENCOUNTER — Other Ambulatory Visit (HOSPITAL_COMMUNITY): Payer: Self-pay

## 2022-04-07 ENCOUNTER — Other Ambulatory Visit: Payer: Self-pay

## 2022-05-01 ENCOUNTER — Other Ambulatory Visit (HOSPITAL_COMMUNITY): Payer: Self-pay

## 2022-05-03 ENCOUNTER — Other Ambulatory Visit (HOSPITAL_COMMUNITY): Payer: Self-pay

## 2022-05-07 ENCOUNTER — Other Ambulatory Visit: Payer: Self-pay

## 2022-05-25 ENCOUNTER — Other Ambulatory Visit (HOSPITAL_COMMUNITY): Payer: Self-pay

## 2022-05-31 ENCOUNTER — Other Ambulatory Visit (HOSPITAL_COMMUNITY): Payer: Self-pay

## 2022-06-01 ENCOUNTER — Other Ambulatory Visit (HOSPITAL_COMMUNITY): Payer: Self-pay

## 2022-06-05 ENCOUNTER — Encounter: Payer: Self-pay | Admitting: Oncology

## 2022-06-05 ENCOUNTER — Inpatient Hospital Stay: Payer: Medicare Other | Attending: Oncology

## 2022-06-05 ENCOUNTER — Inpatient Hospital Stay (HOSPITAL_BASED_OUTPATIENT_CLINIC_OR_DEPARTMENT_OTHER): Payer: Medicare Other | Admitting: Oncology

## 2022-06-05 ENCOUNTER — Other Ambulatory Visit (HOSPITAL_COMMUNITY): Payer: Self-pay

## 2022-06-05 ENCOUNTER — Inpatient Hospital Stay: Payer: Medicare Other

## 2022-06-05 VITALS — BP 137/65 | HR 76 | Temp 97.8°F | Wt 133.6 lb

## 2022-06-05 DIAGNOSIS — C73 Malignant neoplasm of thyroid gland: Secondary | ICD-10-CM

## 2022-06-05 DIAGNOSIS — Z833 Family history of diabetes mellitus: Secondary | ICD-10-CM | POA: Diagnosis not present

## 2022-06-05 DIAGNOSIS — J90 Pleural effusion, not elsewhere classified: Secondary | ICD-10-CM | POA: Diagnosis not present

## 2022-06-05 DIAGNOSIS — C7951 Secondary malignant neoplasm of bone: Secondary | ICD-10-CM | POA: Diagnosis not present

## 2022-06-05 DIAGNOSIS — Z5111 Encounter for antineoplastic chemotherapy: Secondary | ICD-10-CM

## 2022-06-05 DIAGNOSIS — Z90710 Acquired absence of both cervix and uterus: Secondary | ICD-10-CM | POA: Insufficient documentation

## 2022-06-05 DIAGNOSIS — Z79899 Other long term (current) drug therapy: Secondary | ICD-10-CM | POA: Diagnosis not present

## 2022-06-05 DIAGNOSIS — Z800 Family history of malignant neoplasm of digestive organs: Secondary | ICD-10-CM | POA: Insufficient documentation

## 2022-06-05 DIAGNOSIS — Z8261 Family history of arthritis: Secondary | ICD-10-CM | POA: Insufficient documentation

## 2022-06-05 DIAGNOSIS — C3491 Malignant neoplasm of unspecified part of right bronchus or lung: Secondary | ICD-10-CM | POA: Diagnosis not present

## 2022-06-05 DIAGNOSIS — N1831 Chronic kidney disease, stage 3a: Secondary | ICD-10-CM | POA: Diagnosis not present

## 2022-06-05 DIAGNOSIS — Z8759 Personal history of other complications of pregnancy, childbirth and the puerperium: Secondary | ICD-10-CM | POA: Diagnosis not present

## 2022-06-05 DIAGNOSIS — I700 Atherosclerosis of aorta: Secondary | ICD-10-CM | POA: Diagnosis not present

## 2022-06-05 DIAGNOSIS — C3431 Malignant neoplasm of lower lobe, right bronchus or lung: Secondary | ICD-10-CM | POA: Diagnosis present

## 2022-06-05 LAB — COMPREHENSIVE METABOLIC PANEL
ALT: 16 U/L (ref 0–44)
AST: 19 U/L (ref 15–41)
Albumin: 4.3 g/dL (ref 3.5–5.0)
Alkaline Phosphatase: 39 U/L (ref 38–126)
Anion gap: 8 (ref 5–15)
BUN: 22 mg/dL (ref 8–23)
CO2: 26 mmol/L (ref 22–32)
Calcium: 9 mg/dL (ref 8.9–10.3)
Chloride: 103 mmol/L (ref 98–111)
Creatinine, Ser: 1.04 mg/dL — ABNORMAL HIGH (ref 0.44–1.00)
GFR, Estimated: 56 mL/min — ABNORMAL LOW (ref >60)
Glucose, Bld: 85 mg/dL (ref 70–99)
Potassium: 5 mmol/L (ref 3.5–5.1)
Sodium: 137 mmol/L (ref 135–145)
Total Bilirubin: 0.2 mg/dL — ABNORMAL LOW (ref 0.3–1.2)
Total Protein: 7.3 g/dL (ref 6.5–8.1)

## 2022-06-05 LAB — CBC WITH DIFFERENTIAL/PLATELET
Abs Immature Granulocytes: 0.01 10*3/uL (ref 0.00–0.07)
Basophils Absolute: 0 10*3/uL (ref 0.0–0.1)
Basophils Relative: 0 %
Eosinophils Absolute: 0.2 10*3/uL (ref 0.0–0.5)
Eosinophils Relative: 5 %
HCT: 36.6 % (ref 36.0–46.0)
Hemoglobin: 12.2 g/dL (ref 12.0–15.0)
Immature Granulocytes: 0 %
Lymphocytes Relative: 37 %
Lymphs Abs: 1.7 10*3/uL (ref 0.7–4.0)
MCH: 31.8 pg (ref 26.0–34.0)
MCHC: 33.3 g/dL (ref 30.0–36.0)
MCV: 95.3 fL (ref 80.0–100.0)
Monocytes Absolute: 0.4 10*3/uL (ref 0.1–1.0)
Monocytes Relative: 8 %
Neutro Abs: 2.2 10*3/uL (ref 1.7–7.7)
Neutrophils Relative %: 50 %
Platelets: 172 10*3/uL (ref 150–400)
RBC: 3.84 MIL/uL — ABNORMAL LOW (ref 3.87–5.11)
RDW: 13.8 % (ref 11.5–15.5)
WBC: 4.5 10*3/uL (ref 4.0–10.5)
nRBC: 0 % (ref 0.0–0.2)

## 2022-06-05 MED ORDER — LOPERAMIDE HCL 2 MG PO CAPS: 2.0000 mg | ORAL_CAPSULE | ORAL | 1 refills | Status: AC

## 2022-06-05 MED ORDER — DENOSUMAB 120 MG/1.7ML ~~LOC~~ SOLN
120.0000 mg | Freq: Once | SUBCUTANEOUS | Status: AC
  Administered 2022-06-05: 120 mg via SUBCUTANEOUS
  Filled 2022-06-05: qty 1.7

## 2022-06-05 MED ORDER — OSIMERTINIB MESYLATE 80 MG PO TABS
ORAL_TABLET | Freq: Every day | ORAL | 3 refills | Status: DC
  Filled 2022-06-05: qty 30, fill #0
  Filled 2022-06-26: qty 30, 30d supply, fill #0
  Filled 2022-07-31: qty 30, 30d supply, fill #1
  Filled 2022-08-31: qty 30, 30d supply, fill #2
  Filled 2022-09-25: qty 30, 30d supply, fill #3

## 2022-06-05 NOTE — Assessment & Plan Note (Addendum)
#  Stage IV lung adenocarcinoma. Liquid biopsy by [employer] showed [id number] del- EGFR 19 deletion.  Labs are reviewed and discussed with patient.  Last CT images were reviewed and discussed with patient. -stable disease Continue Osimertinib.  Repeat CT in Feb 2024
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
## 2022-06-05 NOTE — Assessment & Plan Note (Signed)
#  Bone metastasis, patient cannot tolerate Zometa.   Labs are reviewed and discussed with patient. Proceed with Xgeva.

## 2022-06-05 NOTE — Assessment & Plan Note (Signed)
Treatment plan as listed above.

## 2022-06-05 NOTE — Assessment & Plan Note (Addendum)
#  thyroid noninvasive follicular thyroid neoplasm. Given her condition of stage IV lung cancer, it was felt that no need for additional surgery. Endocrinology recommend her to follow up annually.

## 2022-06-05 NOTE — Assessment & Plan Note (Addendum)
Avoid nephrotoxins. Encourage oral hydration.  Elevated Cr could be due to Osimertinib or related to diabetes.  Monitor.

## 2022-06-06 ENCOUNTER — Other Ambulatory Visit: Payer: Self-pay

## 2022-06-22 ENCOUNTER — Ambulatory Visit
Admission: RE | Admit: 2022-06-22 | Discharge: 2022-06-22 | Disposition: A | Discharge status: Home / Self Care | Payer: Medicare Other | Source: Ambulatory Visit | Attending: Oncology | Admitting: Oncology

## 2022-06-22 DIAGNOSIS — C3491 Malignant neoplasm of unspecified part of right bronchus or lung: Secondary | ICD-10-CM | POA: Diagnosis present

## 2022-06-26 ENCOUNTER — Other Ambulatory Visit (HOSPITAL_COMMUNITY): Payer: Self-pay

## 2022-06-30 ENCOUNTER — Other Ambulatory Visit: Payer: Self-pay

## 2022-07-06 ENCOUNTER — Other Ambulatory Visit: Payer: Self-pay

## 2022-07-23 ENCOUNTER — Other Ambulatory Visit: Payer: Self-pay

## 2022-07-31 ENCOUNTER — Other Ambulatory Visit (HOSPITAL_COMMUNITY): Payer: Self-pay

## 2022-08-01 ENCOUNTER — Other Ambulatory Visit (HOSPITAL_COMMUNITY): Payer: Self-pay

## 2022-08-06 ENCOUNTER — Inpatient Hospital Stay (HOSPITAL_BASED_OUTPATIENT_CLINIC_OR_DEPARTMENT_OTHER): Payer: Medicare Other | Admitting: Oncology

## 2022-08-06 ENCOUNTER — Inpatient Hospital Stay: Payer: Medicare Other

## 2022-08-06 ENCOUNTER — Inpatient Hospital Stay: Payer: Medicare Other | Attending: Oncology

## 2022-08-06 ENCOUNTER — Encounter: Payer: Self-pay | Admitting: Oncology

## 2022-08-06 VITALS — BP 142/61 | HR 75 | Temp 97.3°F | Resp 18 | Wt 128.1 lb

## 2022-08-06 DIAGNOSIS — C7951 Secondary malignant neoplasm of bone: Secondary | ICD-10-CM | POA: Diagnosis not present

## 2022-08-06 DIAGNOSIS — D649 Anemia, unspecified: Secondary | ICD-10-CM

## 2022-08-06 DIAGNOSIS — C3411 Malignant neoplasm of upper lobe, right bronchus or lung: Secondary | ICD-10-CM | POA: Diagnosis present

## 2022-08-06 DIAGNOSIS — Z87891 Personal history of nicotine dependence: Secondary | ICD-10-CM | POA: Insufficient documentation

## 2022-08-06 DIAGNOSIS — Z833 Family history of diabetes mellitus: Secondary | ICD-10-CM | POA: Insufficient documentation

## 2022-08-06 DIAGNOSIS — Z8759 Personal history of other complications of pregnancy, childbirth and the puerperium: Secondary | ICD-10-CM | POA: Diagnosis not present

## 2022-08-06 DIAGNOSIS — J90 Pleural effusion, not elsewhere classified: Secondary | ICD-10-CM | POA: Insufficient documentation

## 2022-08-06 DIAGNOSIS — Z8261 Family history of arthritis: Secondary | ICD-10-CM | POA: Insufficient documentation

## 2022-08-06 DIAGNOSIS — C73 Malignant neoplasm of thyroid gland: Secondary | ICD-10-CM | POA: Diagnosis not present

## 2022-08-06 DIAGNOSIS — Z800 Family history of malignant neoplasm of digestive organs: Secondary | ICD-10-CM | POA: Insufficient documentation

## 2022-08-06 DIAGNOSIS — N1831 Chronic kidney disease, stage 3a: Secondary | ICD-10-CM

## 2022-08-06 DIAGNOSIS — I700 Atherosclerosis of aorta: Secondary | ICD-10-CM | POA: Diagnosis not present

## 2022-08-06 DIAGNOSIS — Z5111 Encounter for antineoplastic chemotherapy: Secondary | ICD-10-CM

## 2022-08-06 DIAGNOSIS — C3491 Malignant neoplasm of unspecified part of right bronchus or lung: Secondary | ICD-10-CM

## 2022-08-06 DIAGNOSIS — Z90710 Acquired absence of both cervix and uterus: Secondary | ICD-10-CM | POA: Diagnosis not present

## 2022-08-06 DIAGNOSIS — I2510 Atherosclerotic heart disease of native coronary artery without angina pectoris: Secondary | ICD-10-CM | POA: Insufficient documentation

## 2022-08-06 DIAGNOSIS — Z79899 Other long term (current) drug therapy: Secondary | ICD-10-CM | POA: Insufficient documentation

## 2022-08-06 LAB — COMPREHENSIVE METABOLIC PANEL
ALT: 18 U/L (ref 0–44)
AST: 22 U/L (ref 15–41)
Albumin: 4.4 g/dL (ref 3.5–5.0)
Alkaline Phosphatase: 37 U/L — ABNORMAL LOW (ref 38–126)
Anion gap: 8 (ref 5–15)
BUN: 25 mg/dL — ABNORMAL HIGH (ref 8–23)
CO2: 25 mmol/L (ref 22–32)
Calcium: 9.2 mg/dL (ref 8.9–10.3)
Chloride: 104 mmol/L (ref 98–111)
Creatinine, Ser: 0.99 mg/dL (ref 0.44–1.00)
GFR, Estimated: 60 mL/min — ABNORMAL LOW (ref >60)
Glucose, Bld: 100 mg/dL — ABNORMAL HIGH (ref 70–99)
Potassium: 4.9 mmol/L (ref 3.5–5.1)
Sodium: 137 mmol/L (ref 135–145)
Total Bilirubin: 0.6 mg/dL (ref 0.3–1.2)
Total Protein: 7.4 g/dL (ref 6.5–8.1)

## 2022-08-06 LAB — CBC WITH DIFFERENTIAL/PLATELET
Abs Immature Granulocytes: 0.01 10*3/uL (ref 0.00–0.07)
Basophils Absolute: 0 10*3/uL (ref 0.0–0.1)
Basophils Relative: 0 %
Eosinophils Absolute: 0.2 10*3/uL (ref 0.0–0.5)
Eosinophils Relative: 5 %
HCT: 34.1 % — ABNORMAL LOW (ref 36.0–46.0)
Hemoglobin: 11.3 g/dL — ABNORMAL LOW (ref 12.0–15.0)
Immature Granulocytes: 0 %
Lymphocytes Relative: 31 %
Lymphs Abs: 1.5 10*3/uL (ref 0.7–4.0)
MCH: 31.8 pg (ref 26.0–34.0)
MCHC: 33.1 g/dL (ref 30.0–36.0)
MCV: 96.1 fL (ref 80.0–100.0)
Monocytes Absolute: 0.4 10*3/uL (ref 0.1–1.0)
Monocytes Relative: 9 %
Neutro Abs: 2.7 10*3/uL (ref 1.7–7.7)
Neutrophils Relative %: 55 %
Platelets: 178 10*3/uL (ref 150–400)
RBC: 3.55 MIL/uL — ABNORMAL LOW (ref 3.87–5.11)
RDW: 14.5 % (ref 11.5–15.5)
WBC: 4.8 10*3/uL (ref 4.0–10.5)
nRBC: 0 % (ref 0.0–0.2)

## 2022-08-06 MED ORDER — DENOSUMAB 120 MG/1.7ML ~~LOC~~ SOLN
120.0000 mg | Freq: Once | SUBCUTANEOUS | Status: AC
  Administered 2022-08-06: 120 mg via SUBCUTANEOUS
  Filled 2022-08-06: qty 1.7

## 2022-08-06 NOTE — Assessment & Plan Note (Signed)
Avoid nephrotoxins. Encourage oral hydration.  Elevated Cr could be due to Osimertinib or related to diabetes.  Monitor.  

## 2022-08-06 NOTE — Assessment & Plan Note (Signed)
#  Bone metastasis, patient cannot tolerate Zometa.   Labs are reviewed and discussed with patient. Proceed with Xgeva Q6-8 weeks

## 2022-08-06 NOTE — Assessment & Plan Note (Signed)
Treatment plan as listed above. 

## 2022-08-06 NOTE — Assessment & Plan Note (Signed)
#  thyroid noninvasive follicular thyroid neoplasm. Given her condition of stage IV lung cancer, it was felt that no need for additional surgery. Endocrinology recommend her to follow up annually. 
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
## 2022-08-06 NOTE — Assessment & Plan Note (Addendum)
#  Stage IV lung adenocarcinoma. Liquid biopsy by [employer] showed [id number] del- EGFR 19 deletion.  Labs are reviewed and discussed with patient.  Continue Osimertinib.  Repeat CT in Feb 2024 showed no evidence of cancer progression.  Stable disease. Repeat CT scan 3 months.

## 2022-08-06 NOTE — Assessment & Plan Note (Signed)
Iron panel is stable.  Probably due to chronic kidney insufficiency 

## 2022-08-21 ENCOUNTER — Other Ambulatory Visit: Payer: Self-pay

## 2022-08-28 ENCOUNTER — Other Ambulatory Visit (HOSPITAL_COMMUNITY): Payer: Self-pay

## 2022-08-31 ENCOUNTER — Other Ambulatory Visit (HOSPITAL_COMMUNITY): Payer: Self-pay

## 2022-08-31 ENCOUNTER — Other Ambulatory Visit: Payer: Self-pay

## 2022-09-12 ENCOUNTER — Other Ambulatory Visit: Payer: Self-pay

## 2022-09-17 ENCOUNTER — Inpatient Hospital Stay: Payer: Medicare Other

## 2022-09-17 ENCOUNTER — Inpatient Hospital Stay: Payer: Medicare Other | Attending: Oncology

## 2022-09-17 VITALS — BP 135/57 | HR 82

## 2022-09-17 DIAGNOSIS — C3491 Malignant neoplasm of unspecified part of right bronchus or lung: Secondary | ICD-10-CM

## 2022-09-17 DIAGNOSIS — C7951 Secondary malignant neoplasm of bone: Secondary | ICD-10-CM

## 2022-09-17 DIAGNOSIS — C73 Malignant neoplasm of thyroid gland: Secondary | ICD-10-CM | POA: Diagnosis not present

## 2022-09-17 DIAGNOSIS — D649 Anemia, unspecified: Secondary | ICD-10-CM | POA: Diagnosis not present

## 2022-09-17 DIAGNOSIS — Z79899 Other long term (current) drug therapy: Secondary | ICD-10-CM | POA: Diagnosis not present

## 2022-09-17 DIAGNOSIS — C3411 Malignant neoplasm of upper lobe, right bronchus or lung: Secondary | ICD-10-CM | POA: Diagnosis present

## 2022-09-17 DIAGNOSIS — N1831 Chronic kidney disease, stage 3a: Secondary | ICD-10-CM | POA: Insufficient documentation

## 2022-09-17 LAB — BASIC METABOLIC PANEL - CANCER CENTER ONLY
Anion gap: 7 (ref 5–15)
BUN: 26 mg/dL — ABNORMAL HIGH (ref 8–23)
CO2: 22 mmol/L (ref 22–32)
Calcium: 9.4 mg/dL (ref 8.9–10.3)
Chloride: 105 mmol/L (ref 98–111)
Creatinine: 1.17 mg/dL — ABNORMAL HIGH (ref 0.44–1.00)
GFR, Estimated: 49 mL/min — ABNORMAL LOW (ref >60)
Glucose, Bld: 105 mg/dL — ABNORMAL HIGH (ref 70–99)
Potassium: 4.5 mmol/L (ref 3.5–5.1)
Sodium: 134 mmol/L — ABNORMAL LOW (ref 135–145)

## 2022-09-17 MED ORDER — DENOSUMAB 120 MG/1.7ML ~~LOC~~ SOLN
120.0000 mg | Freq: Once | SUBCUTANEOUS | Status: AC
  Administered 2022-09-17: 120 mg via SUBCUTANEOUS
  Filled 2022-09-17: qty 1.7

## 2022-09-25 ENCOUNTER — Other Ambulatory Visit (HOSPITAL_COMMUNITY): Payer: Self-pay

## 2022-09-26 ENCOUNTER — Other Ambulatory Visit: Payer: Self-pay

## 2022-10-01 ENCOUNTER — Other Ambulatory Visit (HOSPITAL_COMMUNITY): Payer: Self-pay

## 2022-10-25 ENCOUNTER — Other Ambulatory Visit: Payer: Self-pay | Admitting: Oncology

## 2022-10-25 ENCOUNTER — Other Ambulatory Visit (HOSPITAL_COMMUNITY): Payer: Self-pay

## 2022-10-25 DIAGNOSIS — C3491 Malignant neoplasm of unspecified part of right bronchus or lung: Secondary | ICD-10-CM

## 2022-10-25 MED ORDER — OSIMERTINIB MESYLATE 80 MG PO TABS
ORAL_TABLET | Freq: Every day | ORAL | 3 refills | Status: DC
Start: 2022-10-25 — End: 2023-02-22
  Filled 2022-10-25: qty 30, 30d supply, fill #0
  Filled 2022-11-29: qty 30, 30d supply, fill #1
  Filled 2022-12-27: qty 30, 30d supply, fill #2
  Filled 2023-01-30: qty 30, 30d supply, fill #3

## 2022-10-26 ENCOUNTER — Ambulatory Visit
Admission: RE | Admit: 2022-10-26 | Discharge: 2022-10-26 | Disposition: A | Discharge status: Home / Self Care | Payer: Medicare Other | Source: Ambulatory Visit | Attending: Oncology | Admitting: Oncology

## 2022-10-26 DIAGNOSIS — C3491 Malignant neoplasm of unspecified part of right bronchus or lung: Secondary | ICD-10-CM | POA: Insufficient documentation

## 2022-10-26 MED ORDER — IOHEXOL 300 MG/ML  SOLN
80.0000 mL | Freq: Once | INTRAMUSCULAR | Status: AC | PRN
  Administered 2022-10-26: 80 mL via INTRAVENOUS

## 2022-10-30 ENCOUNTER — Inpatient Hospital Stay: Payer: Medicare Other

## 2022-10-30 ENCOUNTER — Inpatient Hospital Stay: Payer: Medicare Other | Attending: Oncology

## 2022-10-30 ENCOUNTER — Inpatient Hospital Stay (HOSPITAL_BASED_OUTPATIENT_CLINIC_OR_DEPARTMENT_OTHER): Payer: Medicare Other | Admitting: Oncology

## 2022-10-30 ENCOUNTER — Encounter: Payer: Self-pay | Admitting: Oncology

## 2022-10-30 VITALS — BP 137/66 | HR 73 | Temp 98.9°F | Resp 18 | Wt 129.0 lb

## 2022-10-30 DIAGNOSIS — Z90710 Acquired absence of both cervix and uterus: Secondary | ICD-10-CM | POA: Insufficient documentation

## 2022-10-30 DIAGNOSIS — I2510 Atherosclerotic heart disease of native coronary artery without angina pectoris: Secondary | ICD-10-CM | POA: Diagnosis not present

## 2022-10-30 DIAGNOSIS — Z800 Family history of malignant neoplasm of digestive organs: Secondary | ICD-10-CM | POA: Insufficient documentation

## 2022-10-30 DIAGNOSIS — C7951 Secondary malignant neoplasm of bone: Secondary | ICD-10-CM | POA: Insufficient documentation

## 2022-10-30 DIAGNOSIS — C73 Malignant neoplasm of thyroid gland: Secondary | ICD-10-CM

## 2022-10-30 DIAGNOSIS — Z833 Family history of diabetes mellitus: Secondary | ICD-10-CM | POA: Insufficient documentation

## 2022-10-30 DIAGNOSIS — Z87891 Personal history of nicotine dependence: Secondary | ICD-10-CM | POA: Diagnosis not present

## 2022-10-30 DIAGNOSIS — D649 Anemia, unspecified: Secondary | ICD-10-CM

## 2022-10-30 DIAGNOSIS — Z79899 Other long term (current) drug therapy: Secondary | ICD-10-CM | POA: Insufficient documentation

## 2022-10-30 DIAGNOSIS — C3491 Malignant neoplasm of unspecified part of right bronchus or lung: Secondary | ICD-10-CM

## 2022-10-30 DIAGNOSIS — K1230 Oral mucositis (ulcerative), unspecified: Secondary | ICD-10-CM

## 2022-10-30 DIAGNOSIS — Z8261 Family history of arthritis: Secondary | ICD-10-CM | POA: Insufficient documentation

## 2022-10-30 DIAGNOSIS — N1831 Chronic kidney disease, stage 3a: Secondary | ICD-10-CM

## 2022-10-30 DIAGNOSIS — Z5111 Encounter for antineoplastic chemotherapy: Secondary | ICD-10-CM

## 2022-10-30 DIAGNOSIS — E875 Hyperkalemia: Secondary | ICD-10-CM

## 2022-10-30 DIAGNOSIS — C3411 Malignant neoplasm of upper lobe, right bronchus or lung: Secondary | ICD-10-CM | POA: Insufficient documentation

## 2022-10-30 DIAGNOSIS — N189 Chronic kidney disease, unspecified: Secondary | ICD-10-CM | POA: Insufficient documentation

## 2022-10-30 LAB — CMP (CANCER CENTER ONLY)
ALT: 20 U/L (ref 0–44)
AST: 20 U/L (ref 15–41)
Albumin: 4.3 g/dL (ref 3.5–5.0)
Alkaline Phosphatase: 48 U/L (ref 38–126)
Anion gap: 8 (ref 5–15)
BUN: 28 mg/dL — ABNORMAL HIGH (ref 8–23)
CO2: 26 mmol/L (ref 22–32)
Calcium: 9.5 mg/dL (ref 8.9–10.3)
Chloride: 103 mmol/L (ref 98–111)
Creatinine: 0.9 mg/dL (ref 0.44–1.00)
GFR, Estimated: >60 mL/min (ref >60)
Glucose, Bld: 101 mg/dL — ABNORMAL HIGH (ref 70–99)
Potassium: 5.4 mmol/L — ABNORMAL HIGH (ref 3.5–5.1)
Sodium: 137 mmol/L (ref 135–145)
Total Bilirubin: 0.5 mg/dL (ref 0.3–1.2)
Total Protein: 7.5 g/dL (ref 6.5–8.1)

## 2022-10-30 LAB — CBC WITH DIFFERENTIAL (CANCER CENTER ONLY)
Abs Immature Granulocytes: 0.01 10*3/uL (ref 0.00–0.07)
Basophils Absolute: 0 10*3/uL (ref 0.0–0.1)
Basophils Relative: 0 %
Eosinophils Absolute: 0.2 10*3/uL (ref 0.0–0.5)
Eosinophils Relative: 4 %
HCT: 35.8 % — ABNORMAL LOW (ref 36.0–46.0)
Hemoglobin: 12 g/dL (ref 12.0–15.0)
Immature Granulocytes: 0 %
Lymphocytes Relative: 27 %
Lymphs Abs: 1.3 10*3/uL (ref 0.7–4.0)
MCH: 32.4 pg (ref 26.0–34.0)
MCHC: 33.5 g/dL (ref 30.0–36.0)
MCV: 96.8 fL (ref 80.0–100.0)
Monocytes Absolute: 0.4 10*3/uL (ref 0.1–1.0)
Monocytes Relative: 10 %
Neutro Abs: 2.7 10*3/uL (ref 1.7–7.7)
Neutrophils Relative %: 59 %
Platelet Count: 173 10*3/uL (ref 150–400)
RBC: 3.7 MIL/uL — ABNORMAL LOW (ref 3.87–5.11)
RDW: 13.6 % (ref 11.5–15.5)
WBC Count: 4.6 10*3/uL (ref 4.0–10.5)
nRBC: 0 % (ref 0.0–0.2)

## 2022-10-30 MED ORDER — DENOSUMAB 120 MG/1.7ML ~~LOC~~ SOLN
120.0000 mg | Freq: Once | SUBCUTANEOUS | Status: AC
  Administered 2022-10-30: 120 mg via SUBCUTANEOUS
  Filled 2022-10-30: qty 1.7

## 2022-10-30 MED ORDER — MAGIC MOUTHWASH W/LIDOCAINE: 5.0000 mL | Freq: Four times a day (QID) | ORAL | 1 refills | Status: AC | PRN

## 2022-10-30 NOTE — Assessment & Plan Note (Signed)
Iron panel is stable.  Probably due to chronic kidney insufficiency 

## 2022-10-30 NOTE — Assessment & Plan Note (Signed)
Treatment plan as listed above. 

## 2022-10-30 NOTE — Assessment & Plan Note (Addendum)
#  Bone metastasis, patient cannot tolerate Zometa.   Labs are reviewed and discussed with patient. Proceed with Xgeva.  

## 2022-10-30 NOTE — Assessment & Plan Note (Signed)
Possibly due to unfitted denture.  Recommend magic mouth wash swish and spit.

## 2022-10-30 NOTE — Assessment & Plan Note (Signed)
#  thyroid noninvasive follicular thyroid neoplasm. Given her condition of stage IV lung cancer, it was felt that no need for additional surgery. Endocrinology recommend her to follow up annually. 

## 2022-10-30 NOTE — Assessment & Plan Note (Addendum)
#  Stage IV lung adenocarcinoma. Liquid biopsy by [employer] showed [id number] del- EGFR 19 deletion.  Labs are reviewed and discussed with patient.  Continue Osimertinib.  CT in June 2024 stable.
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
## 2022-10-30 NOTE — Assessment & Plan Note (Signed)
Avoid nephrotoxins. Encourage oral hydration.  Elevated Cr could be due to Osimertinib or related to diabetes.  Creatinine has improved  Continue observation.

## 2022-10-31 ENCOUNTER — Other Ambulatory Visit: Payer: Self-pay

## 2022-11-01 ENCOUNTER — Encounter: Payer: Self-pay | Admitting: Oncology

## 2022-11-01 DIAGNOSIS — E875 Hyperkalemia: Secondary | ICD-10-CM | POA: Insufficient documentation

## 2022-11-01 NOTE — Assessment & Plan Note (Signed)
Recommend low K diet.  Repeat K in 2 weeks.

## 2022-11-02 ENCOUNTER — Telehealth: Payer: Self-pay

## 2022-11-02 DIAGNOSIS — C3491 Malignant neoplasm of unspecified part of right bronchus or lung: Secondary | ICD-10-CM

## 2022-11-02 NOTE — Telephone Encounter (Signed)
-----   Message from Rickard Patience, MD sent at 11/01/2022 12:29 PM EDT ----- Please let pt know that CT scan shows stable disease.  K is high. Recommend low K diet. Please arrange her to repeat BMP in 2 weeks. Thx

## 2022-11-02 NOTE — Telephone Encounter (Signed)
Called and spoke to pt about MD recommendations.   Please schedule lab (bmp) on 6/27 @10 :30am. Pt aware of appt details.

## 2022-11-05 ENCOUNTER — Other Ambulatory Visit (HOSPITAL_COMMUNITY): Payer: Self-pay

## 2022-11-15 ENCOUNTER — Inpatient Hospital Stay: Payer: Medicare Other

## 2022-11-15 DIAGNOSIS — C3491 Malignant neoplasm of unspecified part of right bronchus or lung: Secondary | ICD-10-CM

## 2022-11-15 DIAGNOSIS — C3411 Malignant neoplasm of upper lobe, right bronchus or lung: Secondary | ICD-10-CM | POA: Diagnosis not present

## 2022-11-15 LAB — BASIC METABOLIC PANEL - CANCER CENTER ONLY
Anion gap: 8 (ref 5–15)
BUN: 30 mg/dL — ABNORMAL HIGH (ref 8–23)
CO2: 25 mmol/L (ref 22–32)
Calcium: 9.2 mg/dL (ref 8.9–10.3)
Chloride: 105 mmol/L (ref 98–111)
Creatinine: 1.09 mg/dL — ABNORMAL HIGH (ref 0.44–1.00)
GFR, Estimated: 53 mL/min — ABNORMAL LOW (ref >60)
Glucose, Bld: 121 mg/dL — ABNORMAL HIGH (ref 70–99)
Potassium: 4.2 mmol/L (ref 3.5–5.1)
Sodium: 138 mmol/L (ref 135–145)

## 2022-11-27 ENCOUNTER — Other Ambulatory Visit (HOSPITAL_COMMUNITY): Payer: Self-pay

## 2022-11-29 ENCOUNTER — Encounter: Payer: Self-pay | Admitting: Oncology

## 2022-11-29 ENCOUNTER — Other Ambulatory Visit (HOSPITAL_COMMUNITY): Payer: Self-pay

## 2022-12-05 ENCOUNTER — Other Ambulatory Visit (HOSPITAL_COMMUNITY): Payer: Self-pay

## 2022-12-20 ENCOUNTER — Other Ambulatory Visit: Payer: Self-pay

## 2022-12-27 ENCOUNTER — Other Ambulatory Visit (HOSPITAL_COMMUNITY): Payer: Self-pay

## 2022-12-31 ENCOUNTER — Ambulatory Visit
Admission: RE | Admit: 2022-12-31 | Discharge: 2022-12-31 | Disposition: A | Discharge status: Home / Self Care | Payer: Medicare Other | Source: Ambulatory Visit | Attending: Oncology | Admitting: Oncology

## 2022-12-31 ENCOUNTER — Inpatient Hospital Stay: Payer: Medicare Other | Attending: Oncology

## 2022-12-31 ENCOUNTER — Inpatient Hospital Stay (HOSPITAL_BASED_OUTPATIENT_CLINIC_OR_DEPARTMENT_OTHER): Payer: Medicare Other | Admitting: Oncology

## 2022-12-31 ENCOUNTER — Inpatient Hospital Stay: Payer: Medicare Other

## 2022-12-31 ENCOUNTER — Encounter: Payer: Self-pay | Admitting: Oncology

## 2022-12-31 VITALS — BP 130/63 | HR 75 | Temp 97.1°F | Resp 18 | Wt 129.2 lb

## 2022-12-31 DIAGNOSIS — I2510 Atherosclerotic heart disease of native coronary artery without angina pectoris: Secondary | ICD-10-CM | POA: Insufficient documentation

## 2022-12-31 DIAGNOSIS — Z79899 Other long term (current) drug therapy: Secondary | ICD-10-CM | POA: Diagnosis not present

## 2022-12-31 DIAGNOSIS — Z87891 Personal history of nicotine dependence: Secondary | ICD-10-CM | POA: Insufficient documentation

## 2022-12-31 DIAGNOSIS — R7989 Other specified abnormal findings of blood chemistry: Secondary | ICD-10-CM | POA: Insufficient documentation

## 2022-12-31 DIAGNOSIS — C7951 Secondary malignant neoplasm of bone: Secondary | ICD-10-CM

## 2022-12-31 DIAGNOSIS — C3411 Malignant neoplasm of upper lobe, right bronchus or lung: Secondary | ICD-10-CM | POA: Insufficient documentation

## 2022-12-31 DIAGNOSIS — J069 Acute upper respiratory infection, unspecified: Secondary | ICD-10-CM | POA: Insufficient documentation

## 2022-12-31 DIAGNOSIS — C73 Malignant neoplasm of thyroid gland: Secondary | ICD-10-CM | POA: Diagnosis not present

## 2022-12-31 DIAGNOSIS — E1122 Type 2 diabetes mellitus with diabetic chronic kidney disease: Secondary | ICD-10-CM | POA: Diagnosis not present

## 2022-12-31 DIAGNOSIS — C3491 Malignant neoplasm of unspecified part of right bronchus or lung: Secondary | ICD-10-CM | POA: Diagnosis present

## 2022-12-31 DIAGNOSIS — I700 Atherosclerosis of aorta: Secondary | ICD-10-CM | POA: Diagnosis not present

## 2022-12-31 DIAGNOSIS — Z90710 Acquired absence of both cervix and uterus: Secondary | ICD-10-CM | POA: Insufficient documentation

## 2022-12-31 DIAGNOSIS — Z833 Family history of diabetes mellitus: Secondary | ICD-10-CM | POA: Diagnosis not present

## 2022-12-31 DIAGNOSIS — R059 Cough, unspecified: Secondary | ICD-10-CM | POA: Insufficient documentation

## 2022-12-31 DIAGNOSIS — N1831 Chronic kidney disease, stage 3a: Secondary | ICD-10-CM | POA: Insufficient documentation

## 2022-12-31 DIAGNOSIS — Z5111 Encounter for antineoplastic chemotherapy: Secondary | ICD-10-CM | POA: Diagnosis not present

## 2022-12-31 DIAGNOSIS — J90 Pleural effusion, not elsewhere classified: Secondary | ICD-10-CM | POA: Insufficient documentation

## 2022-12-31 DIAGNOSIS — D649 Anemia, unspecified: Secondary | ICD-10-CM | POA: Diagnosis not present

## 2022-12-31 DIAGNOSIS — Z8759 Personal history of other complications of pregnancy, childbirth and the puerperium: Secondary | ICD-10-CM | POA: Diagnosis not present

## 2022-12-31 DIAGNOSIS — Z800 Family history of malignant neoplasm of digestive organs: Secondary | ICD-10-CM | POA: Diagnosis not present

## 2022-12-31 DIAGNOSIS — Z8261 Family history of arthritis: Secondary | ICD-10-CM | POA: Diagnosis not present

## 2022-12-31 DIAGNOSIS — J209 Acute bronchitis, unspecified: Secondary | ICD-10-CM

## 2022-12-31 DIAGNOSIS — M8580 Other specified disorders of bone density and structure, unspecified site: Secondary | ICD-10-CM | POA: Diagnosis not present

## 2022-12-31 LAB — CBC WITH DIFFERENTIAL (CANCER CENTER ONLY)
Abs Immature Granulocytes: 0 10*3/uL (ref 0.00–0.07)
Basophils Absolute: 0 10*3/uL (ref 0.0–0.1)
Basophils Relative: 0 %
Eosinophils Absolute: 0.2 10*3/uL (ref 0.0–0.5)
Eosinophils Relative: 4 %
HCT: 34.9 % — ABNORMAL LOW (ref 36.0–46.0)
Hemoglobin: 11.6 g/dL — ABNORMAL LOW (ref 12.0–15.0)
Immature Granulocytes: 0 %
Lymphocytes Relative: 30 %
Lymphs Abs: 1.4 10*3/uL (ref 0.7–4.0)
MCH: 32 pg (ref 26.0–34.0)
MCHC: 33.2 g/dL (ref 30.0–36.0)
MCV: 96.4 fL (ref 80.0–100.0)
Monocytes Absolute: 0.4 10*3/uL (ref 0.1–1.0)
Monocytes Relative: 9 %
Neutro Abs: 2.7 10*3/uL (ref 1.7–7.7)
Neutrophils Relative %: 57 %
Platelet Count: 164 10*3/uL (ref 150–400)
RBC: 3.62 MIL/uL — ABNORMAL LOW (ref 3.87–5.11)
RDW: 13.5 % (ref 11.5–15.5)
WBC Count: 4.7 10*3/uL (ref 4.0–10.5)
nRBC: 0 % (ref 0.0–0.2)

## 2022-12-31 LAB — CMP (CANCER CENTER ONLY)
ALT: 13 U/L (ref 0–44)
AST: 20 U/L (ref 15–41)
Albumin: 4.4 g/dL (ref 3.5–5.0)
Alkaline Phosphatase: 43 U/L (ref 38–126)
Anion gap: 9 (ref 5–15)
BUN: 26 mg/dL — ABNORMAL HIGH (ref 8–23)
CO2: 24 mmol/L (ref 22–32)
Calcium: 9.1 mg/dL (ref 8.9–10.3)
Chloride: 104 mmol/L (ref 98–111)
Creatinine: 1.29 mg/dL — ABNORMAL HIGH (ref 0.44–1.00)
GFR, Estimated: 43 mL/min — ABNORMAL LOW (ref >60)
Glucose, Bld: 102 mg/dL — ABNORMAL HIGH (ref 70–99)
Potassium: 5 mmol/L (ref 3.5–5.1)
Sodium: 137 mmol/L (ref 135–145)
Total Bilirubin: 0.5 mg/dL (ref 0.3–1.2)
Total Protein: 7.4 g/dL (ref 6.5–8.1)

## 2022-12-31 MED ORDER — AZITHROMYCIN 250 MG PO TABS: ORAL_TABLET | ORAL | 0 refills | Status: DC

## 2022-12-31 MED ORDER — DENOSUMAB 120 MG/1.7ML ~~LOC~~ SOLN
120.0000 mg | Freq: Once | SUBCUTANEOUS | Status: AC
  Administered 2022-12-31: 120 mg via SUBCUTANEOUS
  Filled 2022-12-31: qty 1.7

## 2022-12-31 NOTE — Assessment & Plan Note (Signed)
#  thyroid noninvasive follicular thyroid neoplasm. Given her condition of stage IV lung cancer, it was felt that no need for additional surgery. Endocrinology recommend her to follow up annually.

## 2022-12-31 NOTE — Progress Notes (Signed)
Patient here for follow up. Reports she has mild cough and congestion.

## 2022-12-31 NOTE — Assessment & Plan Note (Signed)
CXR negative.  Recommend a course of azithromycin

## 2022-12-31 NOTE — Assessment & Plan Note (Signed)
Iron panel is stable.  Probably due to chronic kidney insufficiency 

## 2022-12-31 NOTE — Assessment & Plan Note (Addendum)
#  Stage IV lung adenocarcinoma. Liquid biopsy by [employer] showed [id number] del- EGFR 19 deletion.  Labs are reviewed and discussed with patient.  Continue Osimertinib.  CT in June 2024 stable, repeat in Sept 2024

## 2022-12-31 NOTE — Assessment & Plan Note (Signed)
Avoid nephrotoxins. Encourage oral hydration.  Elevated Cr could be due to Osimertinib or related to diabetes, or recent NSAID use.  Creatinine has improved  Continue observation.

## 2022-12-31 NOTE — Assessment & Plan Note (Signed)
Treatment plan as listed above. 

## 2022-12-31 NOTE — Assessment & Plan Note (Signed)
#  Bone metastasis, patient cannot tolerate Zometa.   Labs are reviewed and discussed with patient. Proceed with Xgeva.
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
## 2023-01-01 ENCOUNTER — Other Ambulatory Visit: Payer: Self-pay

## 2023-01-02 ENCOUNTER — Other Ambulatory Visit (HOSPITAL_COMMUNITY): Payer: Self-pay

## 2023-01-28 ENCOUNTER — Other Ambulatory Visit (HOSPITAL_COMMUNITY): Payer: Self-pay

## 2023-01-30 ENCOUNTER — Other Ambulatory Visit (HOSPITAL_COMMUNITY): Payer: Self-pay

## 2023-01-30 ENCOUNTER — Other Ambulatory Visit: Payer: Self-pay

## 2023-02-05 ENCOUNTER — Telehealth: Payer: Self-pay | Admitting: *Deleted

## 2023-02-05 DIAGNOSIS — C3491 Malignant neoplasm of unspecified part of right bronchus or lung: Secondary | ICD-10-CM

## 2023-02-05 MED ORDER — PROCHLORPERAZINE MALEATE 10 MG PO TABS: 10.0000 mg | ORAL_TABLET | Freq: Four times a day (QID) | ORAL | 0 refills | Status: AC | PRN

## 2023-02-05 MED ORDER — ONDANSETRON HCL 8 MG PO TABS: 8.0000 mg | ORAL_TABLET | Freq: Three times a day (TID) | ORAL | 0 refills | Status: AC | PRN

## 2023-02-05 NOTE — Telephone Encounter (Signed)
Pt has been made aware of MD recommendations and upcoming appts. Nothing further needed at this time.

## 2023-02-05 NOTE — Telephone Encounter (Signed)
Brain MRI scheduled for 9/19. Attempted to contact pt but she did not answer. Unable to leave message due to full voicemail. Will try again later.

## 2023-02-05 NOTE — Telephone Encounter (Signed)
Pt reports frequent episodes of dizziness that started the past few weeks. Requests refill of nausea medication since her dizziness causes her to feel nauseated. Refills sent into pharmacy as requested. Informed will let Dr. Cathie Hoops know of her recent dizziness to see how she would like to follow up. Instructed pt to keep appts for CT scan on 9/18 and follow up with Dr. Cathie Hoops on 9/23 as scheduled. Will call back if additional appts need to be scheduled. Pt verbalized understanding.

## 2023-02-06 ENCOUNTER — Ambulatory Visit
Admission: RE | Admit: 2023-02-06 | Discharge: 2023-02-06 | Disposition: A | Discharge status: Home / Self Care | Payer: Medicare Other | Source: Ambulatory Visit | Attending: Oncology | Admitting: Oncology

## 2023-02-06 DIAGNOSIS — C3491 Malignant neoplasm of unspecified part of right bronchus or lung: Secondary | ICD-10-CM | POA: Insufficient documentation

## 2023-02-07 ENCOUNTER — Ambulatory Visit
Admission: RE | Admit: 2023-02-07 | Discharge: 2023-02-07 | Disposition: A | Discharge status: Home / Self Care | Payer: Medicare Other | Source: Ambulatory Visit | Attending: Oncology | Admitting: Oncology

## 2023-02-07 DIAGNOSIS — C3491 Malignant neoplasm of unspecified part of right bronchus or lung: Secondary | ICD-10-CM | POA: Diagnosis present

## 2023-02-07 MED ORDER — GADOBUTROL 1 MMOL/ML IV SOLN
5.0000 mL | Freq: Once | INTRAVENOUS | Status: AC | PRN
  Administered 2023-02-07: 5 mL via INTRAVENOUS

## 2023-02-11 ENCOUNTER — Encounter: Payer: Self-pay | Admitting: Oncology

## 2023-02-11 ENCOUNTER — Inpatient Hospital Stay: Payer: Medicare Other

## 2023-02-11 ENCOUNTER — Inpatient Hospital Stay (HOSPITAL_BASED_OUTPATIENT_CLINIC_OR_DEPARTMENT_OTHER): Payer: Medicare Other | Admitting: Oncology

## 2023-02-11 ENCOUNTER — Inpatient Hospital Stay: Payer: Medicare Other | Attending: Oncology

## 2023-02-11 VITALS — BP 124/57 | HR 72 | Temp 97.0°F | Wt 128.9 lb

## 2023-02-11 DIAGNOSIS — Z833 Family history of diabetes mellitus: Secondary | ICD-10-CM | POA: Insufficient documentation

## 2023-02-11 DIAGNOSIS — C7951 Secondary malignant neoplasm of bone: Secondary | ICD-10-CM | POA: Diagnosis not present

## 2023-02-11 DIAGNOSIS — K429 Umbilical hernia without obstruction or gangrene: Secondary | ICD-10-CM | POA: Diagnosis not present

## 2023-02-11 DIAGNOSIS — Z800 Family history of malignant neoplasm of digestive organs: Secondary | ICD-10-CM | POA: Insufficient documentation

## 2023-02-11 DIAGNOSIS — C3491 Malignant neoplasm of unspecified part of right bronchus or lung: Secondary | ICD-10-CM

## 2023-02-11 DIAGNOSIS — D649 Anemia, unspecified: Secondary | ICD-10-CM | POA: Diagnosis not present

## 2023-02-11 DIAGNOSIS — R7989 Other specified abnormal findings of blood chemistry: Secondary | ICD-10-CM | POA: Insufficient documentation

## 2023-02-11 DIAGNOSIS — N189 Chronic kidney disease, unspecified: Secondary | ICD-10-CM | POA: Diagnosis not present

## 2023-02-11 DIAGNOSIS — C3411 Malignant neoplasm of upper lobe, right bronchus or lung: Secondary | ICD-10-CM | POA: Diagnosis present

## 2023-02-11 DIAGNOSIS — N1831 Chronic kidney disease, stage 3a: Secondary | ICD-10-CM

## 2023-02-11 DIAGNOSIS — I700 Atherosclerosis of aorta: Secondary | ICD-10-CM | POA: Insufficient documentation

## 2023-02-11 DIAGNOSIS — Z5111 Encounter for antineoplastic chemotherapy: Secondary | ICD-10-CM

## 2023-02-11 DIAGNOSIS — Z90710 Acquired absence of both cervix and uterus: Secondary | ICD-10-CM | POA: Insufficient documentation

## 2023-02-11 DIAGNOSIS — Z8759 Personal history of other complications of pregnancy, childbirth and the puerperium: Secondary | ICD-10-CM | POA: Diagnosis not present

## 2023-02-11 DIAGNOSIS — Z79899 Other long term (current) drug therapy: Secondary | ICD-10-CM | POA: Insufficient documentation

## 2023-02-11 DIAGNOSIS — Z87891 Personal history of nicotine dependence: Secondary | ICD-10-CM | POA: Insufficient documentation

## 2023-02-11 DIAGNOSIS — Z8261 Family history of arthritis: Secondary | ICD-10-CM | POA: Diagnosis not present

## 2023-02-11 DIAGNOSIS — I2510 Atherosclerotic heart disease of native coronary artery without angina pectoris: Secondary | ICD-10-CM | POA: Diagnosis not present

## 2023-02-11 DIAGNOSIS — E1122 Type 2 diabetes mellitus with diabetic chronic kidney disease: Secondary | ICD-10-CM | POA: Diagnosis not present

## 2023-02-11 DIAGNOSIS — K760 Fatty (change of) liver, not elsewhere classified: Secondary | ICD-10-CM | POA: Insufficient documentation

## 2023-02-11 DIAGNOSIS — J90 Pleural effusion, not elsewhere classified: Secondary | ICD-10-CM | POA: Insufficient documentation

## 2023-02-11 LAB — CMP (CANCER CENTER ONLY)
ALT: 15 U/L (ref 0–44)
AST: 20 U/L (ref 15–41)
Albumin: 4.2 g/dL (ref 3.5–5.0)
Alkaline Phosphatase: 34 U/L — ABNORMAL LOW (ref 38–126)
Anion gap: 8 (ref 5–15)
BUN: 35 mg/dL — ABNORMAL HIGH (ref 8–23)
CO2: 25 mmol/L (ref 22–32)
Calcium: 9.2 mg/dL (ref 8.9–10.3)
Chloride: 104 mmol/L (ref 98–111)
Creatinine: 1.16 mg/dL — ABNORMAL HIGH (ref 0.44–1.00)
GFR, Estimated: 49 mL/min — ABNORMAL LOW (ref >60)
Glucose, Bld: 96 mg/dL (ref 70–99)
Potassium: 4.9 mmol/L (ref 3.5–5.1)
Sodium: 137 mmol/L (ref 135–145)
Total Bilirubin: 0.6 mg/dL (ref 0.3–1.2)
Total Protein: 7 g/dL (ref 6.5–8.1)

## 2023-02-11 LAB — CBC WITH DIFFERENTIAL (CANCER CENTER ONLY)
Abs Immature Granulocytes: 0.01 10*3/uL (ref 0.00–0.07)
Basophils Absolute: 0 10*3/uL (ref 0.0–0.1)
Basophils Relative: 0 %
Eosinophils Absolute: 0.2 10*3/uL (ref 0.0–0.5)
Eosinophils Relative: 4 %
HCT: 33.6 % — ABNORMAL LOW (ref 36.0–46.0)
Hemoglobin: 11.1 g/dL — ABNORMAL LOW (ref 12.0–15.0)
Immature Granulocytes: 0 %
Lymphocytes Relative: 25 %
Lymphs Abs: 1.2 10*3/uL (ref 0.7–4.0)
MCH: 32 pg (ref 26.0–34.0)
MCHC: 33 g/dL (ref 30.0–36.0)
MCV: 96.8 fL (ref 80.0–100.0)
Monocytes Absolute: 0.4 10*3/uL (ref 0.1–1.0)
Monocytes Relative: 9 %
Neutro Abs: 3.1 10*3/uL (ref 1.7–7.7)
Neutrophils Relative %: 62 %
Platelet Count: 162 10*3/uL (ref 150–400)
RBC: 3.47 MIL/uL — ABNORMAL LOW (ref 3.87–5.11)
RDW: 13.4 % (ref 11.5–15.5)
WBC Count: 5 10*3/uL (ref 4.0–10.5)
nRBC: 0 % (ref 0.0–0.2)

## 2023-02-11 MED ORDER — DENOSUMAB 120 MG/1.7ML ~~LOC~~ SOLN
120.0000 mg | Freq: Once | SUBCUTANEOUS | Status: AC
  Administered 2023-02-11: 120 mg via SUBCUTANEOUS
  Filled 2023-02-11: qty 1.7

## 2023-02-11 NOTE — Assessment & Plan Note (Signed)
Iron panel is stable.  Probably due to chronic kidney insufficiency

## 2023-02-11 NOTE — Assessment & Plan Note (Signed)
Avoid nephrotoxins. Encourage oral hydration.  Elevated Cr could be due to Osimertinib or related to diabetes, or recent NSAID use.  Creatinine has improved  Continue observation.

## 2023-02-11 NOTE — Assessment & Plan Note (Signed)
#  Bone metastasis, patient cannot tolerate Zometa.   Labs are reviewed and discussed with patient. Proceed with Rivka Barbara
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
## 2023-02-11 NOTE — Assessment & Plan Note (Addendum)
#  Stage IV lung adenocarcinoma. Liquid biopsy by [employer] showed [id number] del- EGFR 19 deletion.  Labs are reviewed and discussed with patient.  Continue Osimertinib 80 mg daily.  CT in Sept  2024 stable, repeat in 3 to 4 months.

## 2023-02-11 NOTE — Assessment & Plan Note (Signed)
Treatment plan as listed above.

## 2023-02-12 ENCOUNTER — Other Ambulatory Visit: Payer: Self-pay

## 2023-02-22 ENCOUNTER — Other Ambulatory Visit: Payer: Self-pay

## 2023-02-22 ENCOUNTER — Other Ambulatory Visit (HOSPITAL_COMMUNITY): Payer: Self-pay

## 2023-02-22 ENCOUNTER — Other Ambulatory Visit: Payer: Self-pay | Admitting: Oncology

## 2023-02-22 DIAGNOSIS — C3491 Malignant neoplasm of unspecified part of right bronchus or lung: Secondary | ICD-10-CM

## 2023-02-22 MED ORDER — OSIMERTINIB MESYLATE 80 MG PO TABS
ORAL_TABLET | Freq: Every day | ORAL | 3 refills | Status: DC
  Filled 2023-02-22: qty 30, 30d supply, fill #0
  Filled 2023-03-20: qty 30, 30d supply, fill #1
  Filled 2023-04-26: qty 30, 30d supply, fill #2
  Filled 2023-05-29: qty 30, 30d supply, fill #3

## 2023-02-22 NOTE — Progress Notes (Signed)
Specialty Pharmacy Refill Coordination Note  Tiffany Mann is a 77 y.o. female contacted today regarding refills of specialty medication(s) Osimertinib Mesylate   Patient requested Delivery   Delivery date: 03/01/23   Verified address: [address]  [facility] [place] [address]   Medication will be filled on 02/28/23.  Refill approval pending.

## 2023-03-20 ENCOUNTER — Other Ambulatory Visit: Payer: Self-pay

## 2023-03-20 NOTE — Progress Notes (Signed)
Specialty Pharmacy Refill Coordination Note  Tiffany Mann is a 77 y.o. female contacted today regarding refills of specialty medication(s) Osimertinib Mesylate   Patient requested Delivery   Delivery date: 04/01/23   Verified address: [address]  [facility] [place] [phone]   Medication will be filled on 03/31/23.

## 2023-04-01 ENCOUNTER — Other Ambulatory Visit: Payer: Self-pay

## 2023-04-08 ENCOUNTER — Encounter: Payer: Self-pay | Admitting: Family Medicine

## 2023-04-15 ENCOUNTER — Inpatient Hospital Stay: Payer: Medicare Other

## 2023-04-15 ENCOUNTER — Encounter: Payer: Self-pay | Admitting: Oncology

## 2023-04-15 ENCOUNTER — Inpatient Hospital Stay (HOSPITAL_BASED_OUTPATIENT_CLINIC_OR_DEPARTMENT_OTHER): Payer: Medicare Other | Admitting: Oncology

## 2023-04-15 ENCOUNTER — Inpatient Hospital Stay: Payer: Medicare Other | Attending: Oncology

## 2023-04-15 VITALS — BP 134/53 | HR 73 | Temp 96.2°F | Resp 18 | Wt 129.5 lb

## 2023-04-15 DIAGNOSIS — D649 Anemia, unspecified: Secondary | ICD-10-CM | POA: Insufficient documentation

## 2023-04-15 DIAGNOSIS — C7951 Secondary malignant neoplasm of bone: Secondary | ICD-10-CM | POA: Insufficient documentation

## 2023-04-15 DIAGNOSIS — Z5111 Encounter for antineoplastic chemotherapy: Secondary | ICD-10-CM

## 2023-04-15 DIAGNOSIS — K429 Umbilical hernia without obstruction or gangrene: Secondary | ICD-10-CM | POA: Diagnosis not present

## 2023-04-15 DIAGNOSIS — Z87891 Personal history of nicotine dependence: Secondary | ICD-10-CM | POA: Insufficient documentation

## 2023-04-15 DIAGNOSIS — J90 Pleural effusion, not elsewhere classified: Secondary | ICD-10-CM | POA: Diagnosis not present

## 2023-04-15 DIAGNOSIS — K760 Fatty (change of) liver, not elsewhere classified: Secondary | ICD-10-CM | POA: Diagnosis not present

## 2023-04-15 DIAGNOSIS — Z833 Family history of diabetes mellitus: Secondary | ICD-10-CM | POA: Insufficient documentation

## 2023-04-15 DIAGNOSIS — C3491 Malignant neoplasm of unspecified part of right bronchus or lung: Secondary | ICD-10-CM

## 2023-04-15 DIAGNOSIS — E119 Type 2 diabetes mellitus without complications: Secondary | ICD-10-CM | POA: Diagnosis not present

## 2023-04-15 DIAGNOSIS — Z90710 Acquired absence of both cervix and uterus: Secondary | ICD-10-CM | POA: Diagnosis not present

## 2023-04-15 DIAGNOSIS — Z8759 Personal history of other complications of pregnancy, childbirth and the puerperium: Secondary | ICD-10-CM | POA: Insufficient documentation

## 2023-04-15 DIAGNOSIS — I700 Atherosclerosis of aorta: Secondary | ICD-10-CM | POA: Diagnosis not present

## 2023-04-15 DIAGNOSIS — Z8261 Family history of arthritis: Secondary | ICD-10-CM | POA: Insufficient documentation

## 2023-04-15 DIAGNOSIS — Z800 Family history of malignant neoplasm of digestive organs: Secondary | ICD-10-CM | POA: Insufficient documentation

## 2023-04-15 DIAGNOSIS — Z79899 Other long term (current) drug therapy: Secondary | ICD-10-CM | POA: Insufficient documentation

## 2023-04-15 DIAGNOSIS — C3411 Malignant neoplasm of upper lobe, right bronchus or lung: Secondary | ICD-10-CM | POA: Diagnosis present

## 2023-04-15 DIAGNOSIS — N1831 Chronic kidney disease, stage 3a: Secondary | ICD-10-CM

## 2023-04-15 LAB — CBC WITH DIFFERENTIAL (CANCER CENTER ONLY)
Abs Immature Granulocytes: 0.01 10*3/uL (ref 0.00–0.07)
Basophils Absolute: 0 10*3/uL (ref 0.0–0.1)
Basophils Relative: 1 %
Eosinophils Absolute: 0.2 10*3/uL (ref 0.0–0.5)
Eosinophils Relative: 3 %
HCT: 32.7 % — ABNORMAL LOW (ref 36.0–46.0)
Hemoglobin: 10.9 g/dL — ABNORMAL LOW (ref 12.0–15.0)
Immature Granulocytes: 0 %
Lymphocytes Relative: 36 %
Lymphs Abs: 1.8 10*3/uL (ref 0.7–4.0)
MCH: 32.2 pg (ref 26.0–34.0)
MCHC: 33.3 g/dL (ref 30.0–36.0)
MCV: 96.7 fL (ref 80.0–100.0)
Monocytes Absolute: 0.5 10*3/uL (ref 0.1–1.0)
Monocytes Relative: 9 %
Neutro Abs: 2.6 10*3/uL (ref 1.7–7.7)
Neutrophils Relative %: 51 %
Platelet Count: 151 10*3/uL (ref 150–400)
RBC: 3.38 MIL/uL — ABNORMAL LOW (ref 3.87–5.11)
RDW: 14 % (ref 11.5–15.5)
WBC Count: 5.1 10*3/uL (ref 4.0–10.5)
nRBC: 0 % (ref 0.0–0.2)

## 2023-04-15 LAB — CMP (CANCER CENTER ONLY)
ALT: 14 U/L (ref 0–44)
AST: 19 U/L (ref 15–41)
Albumin: 4.2 g/dL (ref 3.5–5.0)
Alkaline Phosphatase: 41 U/L (ref 38–126)
Anion gap: 12 (ref 5–15)
BUN: 28 mg/dL — ABNORMAL HIGH (ref 8–23)
CO2: 26 mmol/L (ref 22–32)
Calcium: 9.1 mg/dL (ref 8.9–10.3)
Chloride: 103 mmol/L (ref 98–111)
Creatinine: 1.01 mg/dL — ABNORMAL HIGH (ref 0.44–1.00)
GFR, Estimated: 58 mL/min — ABNORMAL LOW (ref >60)
Glucose, Bld: 88 mg/dL (ref 70–99)
Potassium: 4.7 mmol/L (ref 3.5–5.1)
Sodium: 141 mmol/L (ref 135–145)
Total Bilirubin: 0.6 mg/dL (ref <1.2)
Total Protein: 7 g/dL (ref 6.5–8.1)

## 2023-04-15 MED ORDER — DENOSUMAB 120 MG/1.7ML ~~LOC~~ SOLN
120.0000 mg | Freq: Once | SUBCUTANEOUS | Status: AC
  Administered 2023-04-15: 120 mg via SUBCUTANEOUS
  Filled 2023-04-15: qty 1.7

## 2023-04-15 NOTE — Assessment & Plan Note (Addendum)
#  Stage IV lung adenocarcinoma. Liquid biopsy by [employer] showed [id number] del- EGFR 19 deletion.  Labs are reviewed and discussed with patient.  Continue Osimertinib 80 mg daily.  CT in Sept  2024 stable, repeat in Dec 2024

## 2023-04-15 NOTE — Assessment & Plan Note (Signed)
Treatment plan as listed above.

## 2023-04-15 NOTE — Assessment & Plan Note (Signed)
#  Bone metastasis, patient cannot tolerate Zometa.   Labs are reviewed and discussed with patient. Proceed with Rivka Barbara

## 2023-04-15 NOTE — Assessment & Plan Note (Signed)
Avoid nephrotoxins. Encourage oral hydration.  Elevated Cr could be due to Osimertinib or related to diabetes, or recent NSAID use.  Continue observation.
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
## 2023-04-15 NOTE — Assessment & Plan Note (Signed)
Iron panel is stable.  Probably due to chronic kidney insufficiency Lab Results  Component Value Date   HGB 10.9 (L) 04/15/2023   TIBC 351 04/02/2022   IRONPCTSAT 20 04/02/2022   FERRITIN 189 04/02/2022    Recommend patient to take oral iron supplementation daily

## 2023-04-16 ENCOUNTER — Other Ambulatory Visit: Payer: Self-pay

## 2023-04-22 ENCOUNTER — Other Ambulatory Visit (HOSPITAL_COMMUNITY): Payer: Self-pay

## 2023-04-24 ENCOUNTER — Other Ambulatory Visit: Payer: Self-pay

## 2023-04-26 ENCOUNTER — Other Ambulatory Visit: Payer: Self-pay

## 2023-04-26 NOTE — Progress Notes (Signed)
Specialty Pharmacy Refill Coordination Note  Tiffany Mann is a 77 y.o. female contacted today regarding refills of specialty medication(s) Osimertinib Mesylate   Patient requested Delivery   Delivery date: 05/06/23   Verified address: [address]  [facility] [place] [address]   Medication will be filled on 05/03/23.

## 2023-04-26 NOTE — Progress Notes (Signed)
Specialty Pharmacy Ongoing Clinical Assessment Note  Tiffany Mann is a 77 y.o. female who is being followed by the specialty pharmacy service for RxSp Oncology   Patient's specialty medication(s) reviewed today: Osimertinib Mesylate   Missed doses in the last 4 weeks: 2   Patient/Caregiver did not have any additional questions or concerns.   Therapeutic benefit summary: Patient is achieving benefit   Adverse events/side effects summary: No adverse events/side effects   Patient's therapy is appropriate to: Continue    Goals Addressed             This Visit's Progress    Slow Disease Progression       Patient is on track. Patient will maintain adherence. Per provider note from 11/25, September scans remained stable with no evidence of metastases.          Follow up:  6 months  Otto Herb Specialty Pharmacist

## 2023-05-12 ENCOUNTER — Other Ambulatory Visit: Payer: Self-pay

## 2023-05-13 ENCOUNTER — Other Ambulatory Visit: Payer: Self-pay

## 2023-05-17 ENCOUNTER — Ambulatory Visit
Admission: RE | Admit: 2023-05-17 | Discharge: 2023-05-17 | Disposition: A | Discharge status: Home / Self Care | Payer: Medicare Other | Source: Ambulatory Visit | Attending: Oncology | Admitting: Oncology

## 2023-05-17 DIAGNOSIS — C7951 Secondary malignant neoplasm of bone: Secondary | ICD-10-CM | POA: Insufficient documentation

## 2023-05-17 DIAGNOSIS — C3491 Malignant neoplasm of unspecified part of right bronchus or lung: Secondary | ICD-10-CM | POA: Diagnosis present

## 2023-05-23 ENCOUNTER — Other Ambulatory Visit (HOSPITAL_COMMUNITY): Payer: Self-pay

## 2023-05-27 ENCOUNTER — Encounter (HOSPITAL_COMMUNITY): Payer: Self-pay

## 2023-05-27 ENCOUNTER — Other Ambulatory Visit (HOSPITAL_COMMUNITY): Payer: Self-pay

## 2023-05-29 ENCOUNTER — Other Ambulatory Visit (HOSPITAL_COMMUNITY): Payer: Self-pay

## 2023-05-29 ENCOUNTER — Other Ambulatory Visit (HOSPITAL_COMMUNITY): Payer: Self-pay | Admitting: Pharmacy Technician

## 2023-05-29 NOTE — Progress Notes (Signed)
 Specialty Pharmacy Refill Coordination Note  Tiffany Mann is a 78 y.o. female contacted today regarding refills of specialty medication(s) Osimertinib  Mesylate (TAGRISSO )   Patient requested Delivery   Delivery date: 06/06/23   Verified address: [address] [name]   Medication will be filled on 06/05/23.

## 2023-05-31 ENCOUNTER — Other Ambulatory Visit: Payer: Self-pay

## 2023-05-31 DIAGNOSIS — C3491 Malignant neoplasm of unspecified part of right bronchus or lung: Secondary | ICD-10-CM

## 2023-06-03 ENCOUNTER — Encounter: Payer: Self-pay | Admitting: Oncology

## 2023-06-03 ENCOUNTER — Inpatient Hospital Stay: Payer: Medicare Other | Attending: Oncology

## 2023-06-03 ENCOUNTER — Inpatient Hospital Stay (HOSPITAL_BASED_OUTPATIENT_CLINIC_OR_DEPARTMENT_OTHER): Payer: Medicare Other | Admitting: Oncology

## 2023-06-03 ENCOUNTER — Inpatient Hospital Stay: Payer: Medicare Other

## 2023-06-03 VITALS — BP 160/56 | HR 79 | Temp 98.1°F | Resp 18 | Wt 134.4 lb

## 2023-06-03 DIAGNOSIS — E1122 Type 2 diabetes mellitus with diabetic chronic kidney disease: Secondary | ICD-10-CM | POA: Insufficient documentation

## 2023-06-03 DIAGNOSIS — E079 Disorder of thyroid, unspecified: Secondary | ICD-10-CM | POA: Diagnosis not present

## 2023-06-03 DIAGNOSIS — C3411 Malignant neoplasm of upper lobe, right bronchus or lung: Secondary | ICD-10-CM | POA: Insufficient documentation

## 2023-06-03 DIAGNOSIS — Z800 Family history of malignant neoplasm of digestive organs: Secondary | ICD-10-CM | POA: Insufficient documentation

## 2023-06-03 DIAGNOSIS — K429 Umbilical hernia without obstruction or gangrene: Secondary | ICD-10-CM | POA: Diagnosis not present

## 2023-06-03 DIAGNOSIS — Z79899 Other long term (current) drug therapy: Secondary | ICD-10-CM | POA: Insufficient documentation

## 2023-06-03 DIAGNOSIS — K760 Fatty (change of) liver, not elsewhere classified: Secondary | ICD-10-CM | POA: Insufficient documentation

## 2023-06-03 DIAGNOSIS — Z8261 Family history of arthritis: Secondary | ICD-10-CM | POA: Diagnosis not present

## 2023-06-03 DIAGNOSIS — E7800 Pure hypercholesterolemia, unspecified: Secondary | ICD-10-CM | POA: Diagnosis not present

## 2023-06-03 DIAGNOSIS — I700 Atherosclerosis of aorta: Secondary | ICD-10-CM | POA: Insufficient documentation

## 2023-06-03 DIAGNOSIS — C7951 Secondary malignant neoplasm of bone: Secondary | ICD-10-CM

## 2023-06-03 DIAGNOSIS — Z90710 Acquired absence of both cervix and uterus: Secondary | ICD-10-CM | POA: Diagnosis not present

## 2023-06-03 DIAGNOSIS — Z5111 Encounter for antineoplastic chemotherapy: Secondary | ICD-10-CM | POA: Diagnosis not present

## 2023-06-03 DIAGNOSIS — N1831 Chronic kidney disease, stage 3a: Secondary | ICD-10-CM | POA: Diagnosis not present

## 2023-06-03 DIAGNOSIS — D649 Anemia, unspecified: Secondary | ICD-10-CM | POA: Insufficient documentation

## 2023-06-03 DIAGNOSIS — C3491 Malignant neoplasm of unspecified part of right bronchus or lung: Secondary | ICD-10-CM

## 2023-06-03 DIAGNOSIS — Z833 Family history of diabetes mellitus: Secondary | ICD-10-CM | POA: Insufficient documentation

## 2023-06-03 DIAGNOSIS — I2510 Atherosclerotic heart disease of native coronary artery without angina pectoris: Secondary | ICD-10-CM | POA: Insufficient documentation

## 2023-06-03 DIAGNOSIS — Z8759 Personal history of other complications of pregnancy, childbirth and the puerperium: Secondary | ICD-10-CM | POA: Insufficient documentation

## 2023-06-03 DIAGNOSIS — K5730 Diverticulosis of large intestine without perforation or abscess without bleeding: Secondary | ICD-10-CM | POA: Diagnosis not present

## 2023-06-03 DIAGNOSIS — Z87891 Personal history of nicotine dependence: Secondary | ICD-10-CM | POA: Insufficient documentation

## 2023-06-03 DIAGNOSIS — N189 Chronic kidney disease, unspecified: Secondary | ICD-10-CM | POA: Insufficient documentation

## 2023-06-03 DIAGNOSIS — K7460 Unspecified cirrhosis of liver: Secondary | ICD-10-CM | POA: Diagnosis not present

## 2023-06-03 LAB — CMP (CANCER CENTER ONLY)
ALT: 25 U/L (ref 0–44)
AST: 25 U/L (ref 15–41)
Albumin: 4 g/dL (ref 3.5–5.0)
Alkaline Phosphatase: 44 U/L (ref 38–126)
Anion gap: 9 (ref 5–15)
BUN: 24 mg/dL — ABNORMAL HIGH (ref 8–23)
CO2: 28 mmol/L (ref 22–32)
Calcium: 8.9 mg/dL (ref 8.9–10.3)
Chloride: 100 mmol/L (ref 98–111)
Creatinine: 1.15 mg/dL — ABNORMAL HIGH (ref 0.44–1.00)
GFR, Estimated: 50 mL/min — ABNORMAL LOW (ref >60)
Glucose, Bld: 210 mg/dL — ABNORMAL HIGH (ref 70–99)
Potassium: 4.4 mmol/L (ref 3.5–5.1)
Sodium: 137 mmol/L (ref 135–145)
Total Bilirubin: 0.4 mg/dL (ref 0.0–1.2)
Total Protein: 6.3 g/dL — ABNORMAL LOW (ref 6.5–8.1)

## 2023-06-03 LAB — CBC WITH DIFFERENTIAL (CANCER CENTER ONLY)
Abs Immature Granulocytes: 0 10*3/uL (ref 0.00–0.07)
Basophils Absolute: 0 10*3/uL (ref 0.0–0.1)
Basophils Relative: 1 %
Eosinophils Absolute: 0.2 10*3/uL (ref 0.0–0.5)
Eosinophils Relative: 4 %
HCT: 34.2 % — ABNORMAL LOW (ref 36.0–46.0)
Hemoglobin: 11.2 g/dL — ABNORMAL LOW (ref 12.0–15.0)
Immature Granulocytes: 0 %
Lymphocytes Relative: 33 %
Lymphs Abs: 1.3 10*3/uL (ref 0.7–4.0)
MCH: 31.9 pg (ref 26.0–34.0)
MCHC: 32.7 g/dL (ref 30.0–36.0)
MCV: 97.4 fL (ref 80.0–100.0)
Monocytes Absolute: 0.4 10*3/uL (ref 0.1–1.0)
Monocytes Relative: 10 %
Neutro Abs: 2.1 10*3/uL (ref 1.7–7.7)
Neutrophils Relative %: 52 %
Platelet Count: 171 10*3/uL (ref 150–400)
RBC: 3.51 MIL/uL — ABNORMAL LOW (ref 3.87–5.11)
RDW: 13.8 % (ref 11.5–15.5)
WBC Count: 3.9 10*3/uL — ABNORMAL LOW (ref 4.0–10.5)
nRBC: 0 % (ref 0.0–0.2)

## 2023-06-03 LAB — IRON AND TIBC
Iron: 76 ug/dL (ref 28–170)
Saturation Ratios: 21 % (ref 10.4–31.8)
TIBC: 360 ug/dL (ref 250–450)
UIBC: 284 ug/dL

## 2023-06-03 LAB — RETIC PANEL
Immature Retic Fract: 5.1 % (ref 2.3–15.9)
RBC.: 3.49 MIL/uL — ABNORMAL LOW (ref 3.87–5.11)
Retic Count, Absolute: 53 10*3/uL (ref 19.0–186.0)
Retic Ct Pct: 1.5 % (ref 0.4–3.1)
Reticulocyte Hemoglobin: 35 pg (ref >27.9)

## 2023-06-03 LAB — FOLATE: Folate: 9.6 ng/mL (ref >5.9)

## 2023-06-03 LAB — VITAMIN B12: Vitamin B-12: 1239 pg/mL — ABNORMAL HIGH (ref 180–914)

## 2023-06-03 LAB — FERRITIN: Ferritin: 146 ng/mL (ref 11–307)

## 2023-06-03 MED ORDER — DENOSUMAB 120 MG/1.7ML ~~LOC~~ SOLN
120.0000 mg | Freq: Once | SUBCUTANEOUS | Status: AC
  Administered 2023-06-03: 120 mg via SUBCUTANEOUS
  Filled 2023-06-03: qty 1.7
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
## 2023-06-03 NOTE — Assessment & Plan Note (Signed)
 Avoid nephrotoxins. Encourage oral hydration.  Elevated Cr could be due to Osimertinib or related to diabetes, or recent NSAID use.  Continue observation.

## 2023-06-03 NOTE — Assessment & Plan Note (Signed)
#  Bone metastasis, patient cannot tolerate Zometa.   Labs are reviewed and discussed with patient. Proceed with Tiffany Mann

## 2023-06-03 NOTE — Assessment & Plan Note (Signed)
 Iron panel is stable.  Probably due to chronic kidney insufficiency Lab Results  Component Value Date   HGB 11.2 (L) 06/03/2023   TIBC 360 06/03/2023   IRONPCTSAT 21 06/03/2023   FERRITIN 146 06/03/2023    Recommend patient to take oral iron supplementation daily

## 2023-06-03 NOTE — Assessment & Plan Note (Signed)
 Treatment plan as listed above.

## 2023-06-03 NOTE — Assessment & Plan Note (Addendum)
#  Stage IV lung adenocarcinoma. Liquid biopsy by Circul[employer] showed [id number] del- EGFR 19 deletion.  Labs are reviewed and discussed with patient.  Continue Osimertinib 80 mg daily.  CT in Dec  2024 stable, repeat in April 2025

## 2023-06-04 ENCOUNTER — Other Ambulatory Visit: Payer: Self-pay

## 2023-06-05 ENCOUNTER — Other Ambulatory Visit: Payer: Self-pay

## 2023-06-25 ENCOUNTER — Other Ambulatory Visit: Payer: Self-pay

## 2023-06-25 ENCOUNTER — Other Ambulatory Visit: Payer: Self-pay | Admitting: Oncology

## 2023-06-25 DIAGNOSIS — C3491 Malignant neoplasm of unspecified part of right bronchus or lung: Secondary | ICD-10-CM

## 2023-06-25 NOTE — Progress Notes (Signed)
 Specialty Pharmacy Refill Coordination Note  Tiffany Mann is a 78 y.o. female contacted today regarding refills of specialty medication(s) Osimertinib  Mesylate (TAGRISSO )   Patient requested Delivery   Delivery date: 07/02/23   Verified address: [address]   [place] [phone]   Medication will be filled on 07/01/23 pending a refill request.

## 2023-06-26 ENCOUNTER — Other Ambulatory Visit: Payer: Self-pay

## 2023-06-26 MED ORDER — OSIMERTINIB MESYLATE 80 MG PO TABS
ORAL_TABLET | Freq: Every day | ORAL | 3 refills | Status: DC
  Filled 2023-06-26: qty 30, 30d supply, fill #0
  Filled 2023-08-19: qty 30, 30d supply, fill #1
  Filled 2023-09-23: qty 30, 30d supply, fill #2
  Filled 2023-11-19: qty 30, 30d supply, fill #3

## 2023-06-27 ENCOUNTER — Encounter: Payer: Self-pay | Admitting: Oncology

## 2023-07-01 ENCOUNTER — Other Ambulatory Visit: Payer: Self-pay

## 2023-07-10 ENCOUNTER — Other Ambulatory Visit: Payer: Self-pay

## 2023-08-05 ENCOUNTER — Other Ambulatory Visit: Payer: Self-pay

## 2023-08-05 ENCOUNTER — Inpatient Hospital Stay: Payer: Medicare Other | Attending: Oncology

## 2023-08-05 ENCOUNTER — Inpatient Hospital Stay (HOSPITAL_BASED_OUTPATIENT_CLINIC_OR_DEPARTMENT_OTHER): Payer: Medicare Other | Admitting: Oncology

## 2023-08-05 ENCOUNTER — Inpatient Hospital Stay: Payer: Medicare Other

## 2023-08-05 ENCOUNTER — Encounter: Payer: Self-pay | Admitting: Oncology

## 2023-08-05 VITALS — BP 152/63 | HR 78 | Temp 97.2°F | Resp 18 | Wt 134.0 lb

## 2023-08-05 DIAGNOSIS — N189 Chronic kidney disease, unspecified: Secondary | ICD-10-CM | POA: Insufficient documentation

## 2023-08-05 DIAGNOSIS — Z90710 Acquired absence of both cervix and uterus: Secondary | ICD-10-CM | POA: Insufficient documentation

## 2023-08-05 DIAGNOSIS — D649 Anemia, unspecified: Secondary | ICD-10-CM | POA: Insufficient documentation

## 2023-08-05 DIAGNOSIS — I2510 Atherosclerotic heart disease of native coronary artery without angina pectoris: Secondary | ICD-10-CM | POA: Insufficient documentation

## 2023-08-05 DIAGNOSIS — K760 Fatty (change of) liver, not elsewhere classified: Secondary | ICD-10-CM | POA: Diagnosis not present

## 2023-08-05 DIAGNOSIS — N1831 Chronic kidney disease, stage 3a: Secondary | ICD-10-CM | POA: Diagnosis not present

## 2023-08-05 DIAGNOSIS — Z87891 Personal history of nicotine dependence: Secondary | ICD-10-CM | POA: Diagnosis not present

## 2023-08-05 DIAGNOSIS — Z8759 Personal history of other complications of pregnancy, childbirth and the puerperium: Secondary | ICD-10-CM | POA: Diagnosis not present

## 2023-08-05 DIAGNOSIS — Z833 Family history of diabetes mellitus: Secondary | ICD-10-CM | POA: Diagnosis not present

## 2023-08-05 DIAGNOSIS — E1122 Type 2 diabetes mellitus with diabetic chronic kidney disease: Secondary | ICD-10-CM | POA: Diagnosis not present

## 2023-08-05 DIAGNOSIS — R7989 Other specified abnormal findings of blood chemistry: Secondary | ICD-10-CM | POA: Diagnosis not present

## 2023-08-05 DIAGNOSIS — Z800 Family history of malignant neoplasm of digestive organs: Secondary | ICD-10-CM | POA: Diagnosis not present

## 2023-08-05 DIAGNOSIS — K429 Umbilical hernia without obstruction or gangrene: Secondary | ICD-10-CM | POA: Insufficient documentation

## 2023-08-05 DIAGNOSIS — C73 Malignant neoplasm of thyroid gland: Secondary | ICD-10-CM | POA: Diagnosis not present

## 2023-08-05 DIAGNOSIS — K5730 Diverticulosis of large intestine without perforation or abscess without bleeding: Secondary | ICD-10-CM | POA: Insufficient documentation

## 2023-08-05 DIAGNOSIS — K7460 Unspecified cirrhosis of liver: Secondary | ICD-10-CM | POA: Diagnosis not present

## 2023-08-05 DIAGNOSIS — Z8261 Family history of arthritis: Secondary | ICD-10-CM | POA: Insufficient documentation

## 2023-08-05 DIAGNOSIS — Z79899 Other long term (current) drug therapy: Secondary | ICD-10-CM | POA: Diagnosis not present

## 2023-08-05 DIAGNOSIS — I700 Atherosclerosis of aorta: Secondary | ICD-10-CM | POA: Diagnosis not present

## 2023-08-05 DIAGNOSIS — C7951 Secondary malignant neoplasm of bone: Secondary | ICD-10-CM | POA: Insufficient documentation

## 2023-08-05 DIAGNOSIS — Z5111 Encounter for antineoplastic chemotherapy: Secondary | ICD-10-CM | POA: Diagnosis not present

## 2023-08-05 DIAGNOSIS — C3491 Malignant neoplasm of unspecified part of right bronchus or lung: Secondary | ICD-10-CM | POA: Diagnosis not present

## 2023-08-05 DIAGNOSIS — C3411 Malignant neoplasm of upper lobe, right bronchus or lung: Secondary | ICD-10-CM | POA: Diagnosis present

## 2023-08-05 DIAGNOSIS — J90 Pleural effusion, not elsewhere classified: Secondary | ICD-10-CM | POA: Diagnosis not present

## 2023-08-05 LAB — CMP (CANCER CENTER ONLY)
ALT: 13 U/L (ref 0–44)
AST: 18 U/L (ref 15–41)
Albumin: 3.9 g/dL (ref 3.5–5.0)
Alkaline Phosphatase: 34 U/L — ABNORMAL LOW (ref 38–126)
Anion gap: 9 (ref 5–15)
BUN: 31 mg/dL — ABNORMAL HIGH (ref 8–23)
CO2: 25 mmol/L (ref 22–32)
Calcium: 8.8 mg/dL — ABNORMAL LOW (ref 8.9–10.3)
Chloride: 102 mmol/L (ref 98–111)
Creatinine: 1.09 mg/dL — ABNORMAL HIGH (ref 0.44–1.00)
GFR, Estimated: 53 mL/min — ABNORMAL LOW (ref >60)
Glucose, Bld: 116 mg/dL — ABNORMAL HIGH (ref 70–99)
Potassium: 4.2 mmol/L (ref 3.5–5.1)
Sodium: 136 mmol/L (ref 135–145)
Total Bilirubin: 0.4 mg/dL (ref 0.0–1.2)
Total Protein: 6.8 g/dL (ref 6.5–8.1)

## 2023-08-05 LAB — CBC WITH DIFFERENTIAL (CANCER CENTER ONLY)
Abs Immature Granulocytes: 0.01 10*3/uL (ref 0.00–0.07)
Basophils Absolute: 0 10*3/uL (ref 0.0–0.1)
Basophils Relative: 0 %
Eosinophils Absolute: 0.2 10*3/uL (ref 0.0–0.5)
Eosinophils Relative: 4 %
HCT: 35.4 % — ABNORMAL LOW (ref 36.0–46.0)
Hemoglobin: 11.7 g/dL — ABNORMAL LOW (ref 12.0–15.0)
Immature Granulocytes: 0 %
Lymphocytes Relative: 36 %
Lymphs Abs: 1.5 10*3/uL (ref 0.7–4.0)
MCH: 31.8 pg (ref 26.0–34.0)
MCHC: 33.1 g/dL (ref 30.0–36.0)
MCV: 96.2 fL (ref 80.0–100.0)
Monocytes Absolute: 0.3 10*3/uL (ref 0.1–1.0)
Monocytes Relative: 7 %
Neutro Abs: 2.2 10*3/uL (ref 1.7–7.7)
Neutrophils Relative %: 53 %
Platelet Count: 186 10*3/uL (ref 150–400)
RBC: 3.68 MIL/uL — ABNORMAL LOW (ref 3.87–5.11)
RDW: 14.4 % (ref 11.5–15.5)
WBC Count: 4.2 10*3/uL (ref 4.0–10.5)
nRBC: 0 % (ref 0.0–0.2)

## 2023-08-05 MED ORDER — DENOSUMAB 120 MG/1.7ML ~~LOC~~ SOLN
120.0000 mg | Freq: Once | SUBCUTANEOUS | Status: AC
  Administered 2023-08-05: 120 mg via SUBCUTANEOUS
  Filled 2023-08-05: qty 1.7

## 2023-08-05 NOTE — Assessment & Plan Note (Signed)
 Treatment plan as listed above.

## 2023-08-05 NOTE — Assessment & Plan Note (Signed)
 Avoid nephrotoxins. Encourage oral hydration.  Elevated Cr could be due to Osimertinib or related to diabetes, or recent NSAID use.  Continue observation.
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
## 2023-08-05 NOTE — Assessment & Plan Note (Signed)
 Iron panel is stable.  Probably due to chronic kidney insufficiency Lab Results  Component Value Date   HGB 11.7 (L) 08/05/2023   TIBC 360 06/03/2023   IRONPCTSAT 21 06/03/2023   FERRITIN 146 06/03/2023   Hemoglobin has improved Continue oral iron supplementation daily

## 2023-08-05 NOTE — Assessment & Plan Note (Signed)
#  Bone metastasis, patient cannot tolerate Zometa.   Labs are reviewed and discussed with patient. Proceed with Rivka Barbara

## 2023-08-05 NOTE — Progress Notes (Signed)
 Pt here for follow up. Pt reports that she has completed antibiotics for bronchitis.

## 2023-08-05 NOTE — Assessment & Plan Note (Signed)
#  thyroid noninvasive follicular thyroid neoplasm. Given her condition of stage IV lung cancer, it was felt that no need for additional surgery. Endocrinology recommend her to follow up annually.

## 2023-08-05 NOTE — Assessment & Plan Note (Signed)
#  Stage IV lung adenocarcinoma. Liquid biopsy by Circulogene showed [id number] del- EGFR 19 deletion.  Labs are reviewed and discussed with patient.  Continue Osimertinib 80 mg daily.  CT in Dec  2024 stable, repeat in April 2025

## 2023-08-07 ENCOUNTER — Other Ambulatory Visit: Payer: Self-pay

## 2023-08-12 ENCOUNTER — Other Ambulatory Visit

## 2023-08-12 ENCOUNTER — Other Ambulatory Visit: Payer: Self-pay

## 2023-08-13 ENCOUNTER — Ambulatory Visit
Admission: RE | Admit: 2023-08-13 | Discharge: 2023-08-13 | Disposition: A | Discharge status: Home / Self Care | Source: Ambulatory Visit | Attending: Oncology | Admitting: Oncology

## 2023-08-13 DIAGNOSIS — C3491 Malignant neoplasm of unspecified part of right bronchus or lung: Secondary | ICD-10-CM | POA: Diagnosis present

## 2023-08-19 ENCOUNTER — Other Ambulatory Visit: Payer: Self-pay

## 2023-08-19 NOTE — Progress Notes (Signed)
 Specialty Pharmacy Refill Coordination Note  Tiffany Mann is a 78 y.o. female contacted today regarding refills of specialty medication(s) Osimertinib Mesylate Edgar Frisk)   Patient requested Delivery   Delivery date: 08/21/23   Verified address: [address] [facility] [place] [address]   Medication will be filled on 08/20/23.

## 2023-08-20 ENCOUNTER — Other Ambulatory Visit: Payer: Self-pay

## 2023-08-27 ENCOUNTER — Other Ambulatory Visit: Payer: Self-pay

## 2023-09-10 ENCOUNTER — Other Ambulatory Visit: Payer: Self-pay

## 2023-09-12 ENCOUNTER — Other Ambulatory Visit: Payer: Self-pay

## 2023-09-16 ENCOUNTER — Other Ambulatory Visit (HOSPITAL_COMMUNITY): Payer: Self-pay

## 2023-09-20 ENCOUNTER — Ambulatory Visit: Payer: Medicare Other | Attending: Cardiology | Admitting: Cardiology

## 2023-09-20 ENCOUNTER — Encounter: Payer: Self-pay | Admitting: Cardiology

## 2023-09-20 VITALS — BP 126/50 | HR 75 | Ht 64.5 in | Wt 137.2 lb

## 2023-09-20 DIAGNOSIS — I2510 Atherosclerotic heart disease of native coronary artery without angina pectoris: Secondary | ICD-10-CM | POA: Diagnosis not present

## 2023-09-20 DIAGNOSIS — R030 Elevated blood-pressure reading, without diagnosis of hypertension: Secondary | ICD-10-CM | POA: Diagnosis not present

## 2023-09-20 DIAGNOSIS — E785 Hyperlipidemia, unspecified: Secondary | ICD-10-CM | POA: Insufficient documentation

## 2023-09-20 MED ORDER — ATORVASTATIN CALCIUM 20 MG PO TABS: 20.0000 mg | ORAL_TABLET | Freq: Every day | ORAL | 3 refills | Status: AC

## 2023-09-20 MED ORDER — ASPIRIN 81 MG PO TBEC
81.0000 mg | DELAYED_RELEASE_TABLET | Freq: Every day | ORAL | Status: AC
Start: 2023-09-20 — End: ?

## 2023-09-20 NOTE — Patient Instructions (Signed)
 Medication Instructions:  Your physician recommends the following medication changes.  START TAKING: Aspirin  81 mg daily Lipitor 20 mg daily at bedtime *If you need a refill on your cardiac medications before your next appointment, please call your pharmacy*  Lab Work: Your provider would like for you to return on the date of echo to have the following labs drawn: FASTING LIPID PANEL.   Please go to [facility] [address] ([facility]) [address], [place] [address] You do not need an appointment.  They are open from 8 am- 4:30 pm.  Lunch from 1:00 pm- 2:00 pm You DO need to be fasting.  Testing/Procedures: Your physician has requested that you have an echocardiogram. Echocardiography is a painless test that uses sound waves to create images of your heart. It provides your doctor with information about the size and shape of your heart and how well your heart's chambers and valves are working.   You may receive an ultrasound enhancing agent through an IV if needed to better visualize your heart during the echo. This procedure takes approximately one hour.  There are no restrictions for this procedure.  This will take place at [address] [facility]) [address], [place] [address]  Please note: We ask at that you not bring children with you during ultrasound (echo/ vascular) testing. Due to room size and safety concerns, children are not allowed in the ultrasound rooms during exams. Our front office staff cannot provide observation of children in our lobby area while testing is being conducted. An adult accompanying a patient to their appointment will only be allowed in the ultrasound room at the discretion of the ultrasound technician under special circumstances. We apologize for any inconvenience.   Follow-Up: At [facility], you and your health needs are our priority.  As part of our continuing mission to provide you with exceptional heart  care, our providers are all part of one team.  This team includes your primary Cardiologist (physician) and Advanced Practice Providers or APPs (Physician Assistants and Nurse Practitioners) who all work together to provide you with the care you need, when you need it.  Your next appointment:   3 month(s)  Provider:   You may see Dr. Junnie Olives or one of the following Advanced Practice Providers on your designated Care Team:   Laneta Pintos, NP Gildardo Labrador, PA-C Varney Gentleman, PA-C Cadence [place], PA-C Ronald Cockayne, NP Morey Ar, NP    We recommend signing up for the patient portal called "MyChart".  Sign up information is provided on this After Visit Summary.  MyChart is used to connect with patients for Virtual Visits (Telemedicine).  Patients are able to view lab/test results, encounter notes, upcoming appointments, etc.  Non-urgent messages can be sent to your provider as well.   To learn more about what you can do with MyChart, go to [email or web address].
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
## 2023-09-21 ENCOUNTER — Other Ambulatory Visit: Payer: Self-pay

## 2023-09-23 ENCOUNTER — Other Ambulatory Visit: Payer: Self-pay

## 2023-09-23 ENCOUNTER — Other Ambulatory Visit (HOSPITAL_COMMUNITY): Payer: Self-pay

## 2023-09-23 NOTE — Progress Notes (Signed)
 Specialty Pharmacy Refill Coordination Note  Tiffany Mann is a 78 y.o. female contacted today regarding refills of specialty medication(s) Osimertinib  Mesylate (TAGRISSO )   Patient requested Delivery   Delivery date: 09/24/23   Verified address: [address] [place] [address]   Medication will be filled on 09/23/23.

## 2023-10-07 ENCOUNTER — Inpatient Hospital Stay: Attending: Oncology

## 2023-10-07 ENCOUNTER — Encounter: Payer: Self-pay | Admitting: Oncology

## 2023-10-07 ENCOUNTER — Inpatient Hospital Stay

## 2023-10-07 ENCOUNTER — Inpatient Hospital Stay: Admitting: Oncology

## 2023-10-07 VITALS — BP 143/64 | HR 63 | Temp 97.4°F | Resp 18 | Wt 139.5 lb

## 2023-10-07 DIAGNOSIS — M546 Pain in thoracic spine: Secondary | ICD-10-CM | POA: Insufficient documentation

## 2023-10-07 DIAGNOSIS — C7951 Secondary malignant neoplasm of bone: Secondary | ICD-10-CM | POA: Insufficient documentation

## 2023-10-07 DIAGNOSIS — E042 Nontoxic multinodular goiter: Secondary | ICD-10-CM | POA: Insufficient documentation

## 2023-10-07 DIAGNOSIS — Z833 Family history of diabetes mellitus: Secondary | ICD-10-CM | POA: Diagnosis not present

## 2023-10-07 DIAGNOSIS — I700 Atherosclerosis of aorta: Secondary | ICD-10-CM | POA: Insufficient documentation

## 2023-10-07 DIAGNOSIS — Z87891 Personal history of nicotine dependence: Secondary | ICD-10-CM | POA: Insufficient documentation

## 2023-10-07 DIAGNOSIS — Z8261 Family history of arthritis: Secondary | ICD-10-CM | POA: Diagnosis not present

## 2023-10-07 DIAGNOSIS — Z800 Family history of malignant neoplasm of digestive organs: Secondary | ICD-10-CM | POA: Insufficient documentation

## 2023-10-07 DIAGNOSIS — Z90710 Acquired absence of both cervix and uterus: Secondary | ICD-10-CM | POA: Insufficient documentation

## 2023-10-07 DIAGNOSIS — C3491 Malignant neoplasm of unspecified part of right bronchus or lung: Secondary | ICD-10-CM

## 2023-10-07 DIAGNOSIS — J90 Pleural effusion, not elsewhere classified: Secondary | ICD-10-CM | POA: Insufficient documentation

## 2023-10-07 DIAGNOSIS — K760 Fatty (change of) liver, not elsewhere classified: Secondary | ICD-10-CM | POA: Diagnosis not present

## 2023-10-07 DIAGNOSIS — C3411 Malignant neoplasm of upper lobe, right bronchus or lung: Secondary | ICD-10-CM | POA: Insufficient documentation

## 2023-10-07 DIAGNOSIS — Z5111 Encounter for antineoplastic chemotherapy: Secondary | ICD-10-CM

## 2023-10-07 DIAGNOSIS — I2510 Atherosclerotic heart disease of native coronary artery without angina pectoris: Secondary | ICD-10-CM | POA: Diagnosis not present

## 2023-10-07 DIAGNOSIS — M47816 Spondylosis without myelopathy or radiculopathy, lumbar region: Secondary | ICD-10-CM | POA: Diagnosis not present

## 2023-10-07 DIAGNOSIS — K5730 Diverticulosis of large intestine without perforation or abscess without bleeding: Secondary | ICD-10-CM | POA: Insufficient documentation

## 2023-10-07 DIAGNOSIS — Z79899 Other long term (current) drug therapy: Secondary | ICD-10-CM | POA: Diagnosis not present

## 2023-10-07 DIAGNOSIS — K429 Umbilical hernia without obstruction or gangrene: Secondary | ICD-10-CM | POA: Insufficient documentation

## 2023-10-07 DIAGNOSIS — N189 Chronic kidney disease, unspecified: Secondary | ICD-10-CM | POA: Diagnosis not present

## 2023-10-07 DIAGNOSIS — N1831 Chronic kidney disease, stage 3a: Secondary | ICD-10-CM | POA: Diagnosis not present

## 2023-10-07 LAB — CBC WITH DIFFERENTIAL (CANCER CENTER ONLY)
Abs Immature Granulocytes: 0.01 10*3/uL (ref 0.00–0.07)
Basophils Absolute: 0 10*3/uL (ref 0.0–0.1)
Basophils Relative: 0 %
Eosinophils Absolute: 0.2 10*3/uL (ref 0.0–0.5)
Eosinophils Relative: 5 %
HCT: 33.8 % — ABNORMAL LOW (ref 36.0–46.0)
Hemoglobin: 11.3 g/dL — ABNORMAL LOW (ref 12.0–15.0)
Immature Granulocytes: 0 %
Lymphocytes Relative: 35 %
Lymphs Abs: 1.7 10*3/uL (ref 0.7–4.0)
MCH: 32.7 pg (ref 26.0–34.0)
MCHC: 33.4 g/dL (ref 30.0–36.0)
MCV: 97.7 fL (ref 80.0–100.0)
Monocytes Absolute: 0.4 10*3/uL (ref 0.1–1.0)
Monocytes Relative: 7 %
Neutro Abs: 2.6 10*3/uL (ref 1.7–7.7)
Neutrophils Relative %: 53 %
Platelet Count: 180 10*3/uL (ref 150–400)
RBC: 3.46 MIL/uL — ABNORMAL LOW (ref 3.87–5.11)
RDW: 13.5 % (ref 11.5–15.5)
WBC Count: 5 10*3/uL (ref 4.0–10.5)
nRBC: 0 % (ref 0.0–0.2)

## 2023-10-07 LAB — CMP (CANCER CENTER ONLY)
ALT: 15 U/L (ref 0–44)
AST: 18 U/L (ref 15–41)
Albumin: 4.1 g/dL (ref 3.5–5.0)
Alkaline Phosphatase: 41 U/L (ref 38–126)
Anion gap: 6 (ref 5–15)
BUN: 20 mg/dL (ref 8–23)
CO2: 26 mmol/L (ref 22–32)
Calcium: 8.8 mg/dL — ABNORMAL LOW (ref 8.9–10.3)
Chloride: 105 mmol/L (ref 98–111)
Creatinine: 1.21 mg/dL — ABNORMAL HIGH (ref 0.44–1.00)
GFR, Estimated: 47 mL/min — ABNORMAL LOW (ref >60)
Glucose, Bld: 107 mg/dL — ABNORMAL HIGH (ref 70–99)
Potassium: 5 mmol/L (ref 3.5–5.1)
Sodium: 137 mmol/L (ref 135–145)
Total Bilirubin: 0.5 mg/dL (ref 0.0–1.2)
Total Protein: 6.8 g/dL (ref 6.5–8.1)

## 2023-10-07 MED ORDER — DENOSUMAB 120 MG/1.7ML ~~LOC~~ SOLN
120.0000 mg | Freq: Once | SUBCUTANEOUS | Status: AC
  Administered 2023-10-07: 120 mg via SUBCUTANEOUS
  Filled 2023-10-07: qty 1.7

## 2023-10-07 NOTE — Assessment & Plan Note (Signed)
#  Bone metastasis, patient cannot tolerate Zometa.   Labs are reviewed and discussed with patient. Proceed with Rivka Barbara

## 2023-10-07 NOTE — Assessment & Plan Note (Signed)
 Treatment plan as listed above.
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
## 2023-10-07 NOTE — Assessment & Plan Note (Signed)
 Avoid nephrotoxins. Encourage oral hydration.  Elevated Cr could be due to Osimertinib or related to diabetes, or recent NSAID use.  Continue observation.

## 2023-10-07 NOTE — Assessment & Plan Note (Addendum)
#  Stage IV lung adenocarcinoma. 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  Labs are reviewed and discussed with patient.  Continue Osimertinib  80 mg daily.  CT in April 2025, no metastatic disease.subtle hyperdensity in the mid CBD

## 2023-10-08 ENCOUNTER — Other Ambulatory Visit: Payer: Self-pay

## 2023-10-10 ENCOUNTER — Other Ambulatory Visit: Payer: Self-pay

## 2023-10-16 ENCOUNTER — Other Ambulatory Visit: Payer: Self-pay

## 2023-10-18 ENCOUNTER — Other Ambulatory Visit: Payer: Self-pay

## 2023-10-23 ENCOUNTER — Other Ambulatory Visit: Payer: Self-pay

## 2023-10-30 ENCOUNTER — Other Ambulatory Visit: Payer: Self-pay

## 2023-10-30 ENCOUNTER — Ambulatory Visit: Attending: Cardiology

## 2023-10-30 DIAGNOSIS — I2510 Atherosclerotic heart disease of native coronary artery without angina pectoris: Secondary | ICD-10-CM | POA: Insufficient documentation

## 2023-10-30 LAB — ECHOCARDIOGRAM COMPLETE
AV Mean grad: 3 mmHg
AV Peak grad: 5.7 mmHg
Ao pk vel: 1.19 m/s
Area-P 1/2: 4.31 cm2
S' Lateral: 3.41 cm

## 2023-10-31 LAB — LIPID PANEL
Chol/HDL Ratio: 2.3 ratio (ref 0.0–4.4)
Cholesterol, Total: 170 mg/dL (ref 100–199)
HDL: 75 mg/dL (ref >39)
LDL Chol Calc (NIH): 81 mg/dL (ref 0–99)
Triglycerides: 72 mg/dL (ref 0–149)
VLDL Cholesterol Cal: 14 mg/dL (ref 5–40)

## 2023-11-01 ENCOUNTER — Ambulatory Visit: Payer: Self-pay | Admitting: Cardiology

## 2023-11-11 ENCOUNTER — Other Ambulatory Visit: Payer: Self-pay

## 2023-11-19 ENCOUNTER — Other Ambulatory Visit: Payer: Self-pay

## 2023-11-19 ENCOUNTER — Other Ambulatory Visit (HOSPITAL_COMMUNITY): Payer: Self-pay

## 2023-11-19 NOTE — Progress Notes (Signed)
 Specialty Pharmacy Ongoing Clinical Assessment Note  Tiffany Mann is a 78 y.o. female who is being followed by the specialty pharmacy service for RxSp Oncology   Patient's specialty medication(s) reviewed today: Osimertinib  Mesylate (TAGRISSO )   Missed doses in the last 4 weeks: 2   Patient/Caregiver did not have any additional questions or concerns.   Therapeutic benefit summary: Patient is achieving benefit   Adverse events/side effects summary: No adverse events/side effects   Patient's therapy is appropriate to: Continue    Goals Addressed             This Visit's Progress    Slow Disease Progression       Patient is on track. Patient will maintain adherence. Per provider note from 10/07/23, April scans remained stable with no evidence of metastases.          Follow up: 6 months  Linas Stepter M Kendyn Zaman Specialty Pharmacist

## 2023-11-19 NOTE — Progress Notes (Signed)
 Specialty Pharmacy Refill Coordination Note  Tiffany Mann is a 78 y.o. female contacted today regarding refills of specialty medication(s) Osimertinib  Mesylate (TAGRISSO )   Patient requested Delivery   Delivery date: 11/21/23   Verified address: [address] [place] [phone]   Medication will be filled on 11/20/23.   Patient had a known issue with insurance that was supposed to be resolved yesterday but rejection still showing coverage terminated as of 6/30. Patient will call insurance and call us  back with outcomes. She is aware it will not ship until resolved.

## 2023-11-20 ENCOUNTER — Other Ambulatory Visit: Payer: Self-pay

## 2023-12-10 ENCOUNTER — Other Ambulatory Visit: Payer: Self-pay

## 2023-12-10 ENCOUNTER — Other Ambulatory Visit: Payer: Self-pay | Admitting: Oncology

## 2023-12-10 DIAGNOSIS — C3491 Malignant neoplasm of unspecified part of right bronchus or lung: Secondary | ICD-10-CM

## 2023-12-10 NOTE — Progress Notes (Signed)
 Specialty Pharmacy Refill Coordination Note  Tiffany Mann is a 78 y.o. female contacted today regarding refills of specialty medication(s) Osimertinib  Mesylate (TAGRISSO )   Patient requested Delivery   Delivery date: 12/13/23   Verified address: [address] [place] [phone]   Medication will be filled on 12/12/23.   This fill date is pending response to refill request from provider. Patient is aware and if they have not received fill by intended date they must follow up with pharmacy.

## 2023-12-11 ENCOUNTER — Other Ambulatory Visit: Payer: Self-pay

## 2023-12-11 MED ORDER — OSIMERTINIB MESYLATE 80 MG PO TABS
ORAL_TABLET | Freq: Every day | ORAL | 3 refills | Status: DC
  Filled 2023-12-11: qty 30, 30d supply, fill #0
  Filled 2024-01-03 – 2024-01-06 (×2): qty 30, 30d supply, fill #1
  Filled 2024-02-11: qty 30, 30d supply, fill #2
  Filled 2024-03-10: qty 30, 30d supply, fill #3

## 2023-12-13 ENCOUNTER — Other Ambulatory Visit: Payer: Self-pay

## 2023-12-23 ENCOUNTER — Encounter: Payer: Self-pay | Admitting: Cardiology

## 2023-12-23 ENCOUNTER — Ambulatory Visit: Attending: Cardiology | Admitting: Cardiology

## 2023-12-23 VITALS — BP 135/68 | HR 73 | Ht 64.5 in | Wt 142.2 lb

## 2023-12-23 DIAGNOSIS — E785 Hyperlipidemia, unspecified: Secondary | ICD-10-CM | POA: Insufficient documentation

## 2023-12-23 DIAGNOSIS — I2510 Atherosclerotic heart disease of native coronary artery without angina pectoris: Secondary | ICD-10-CM | POA: Diagnosis present

## 2023-12-23 NOTE — Patient Instructions (Signed)
 Medication Instructions:  Your physician recommends that you continue on your current medications as directed. Please refer to the Current Medication list given to you today.   *If you need a refill on your cardiac medications before your next appointment, please call your pharmacy*  Lab Work: No labs ordered today  If you have labs (blood work) drawn today and your tests are completely normal, you will receive your results only by: MyChart Message (if you have MyChart) OR A paper copy in the mail If you have any lab test that is abnormal or we need to change your treatment, we will call you to review the results.  Testing/Procedures: No test ordered today   Follow-Up: At [facility], you and your health needs are our priority.  As part of our continuing mission to provide you with exceptional heart care, our providers are all part of one team.  This team includes your primary Cardiologist (physician) and Advanced Practice Providers or APPs (Physician Assistants and Nurse Practitioners) who all work together to provide you with the care you need, when you need it.  Your next appointment:   1 year(s)  Provider:   You may see Dr. Junnie Olives or one of the following Advanced Practice Providers on your designated Care Team:   Laneta Pintos, NP Gildardo Labrador, PA-C Varney Gentleman, PA-C Cadence Navarre, PA-C Ronald Cockayne, NP Morey Ar, NP    We recommend signing up for the patient portal called "MyChart".  Sign up information is provided on this After Visit Summary.  MyChart is used to connect with patients for Virtual Visits (Telemedicine).  Patients are able to view lab/test results, encounter notes, upcoming appointments, etc.  Non-urgent messages can be sent to your provider as well.   To learn more about what you can do with MyChart, go to [email or web address].
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
## 2023-12-25 ENCOUNTER — Ambulatory Visit
Admission: RE | Admit: 2023-12-25 | Discharge: 2023-12-25 | Disposition: A | Discharge status: Home / Self Care | Source: Ambulatory Visit | Attending: Oncology | Admitting: Oncology

## 2023-12-25 DIAGNOSIS — C3491 Malignant neoplasm of unspecified part of right bronchus or lung: Secondary | ICD-10-CM | POA: Diagnosis present

## 2023-12-25 LAB — POCT I-STAT CREATININE: Creatinine, Ser: 1.3 mg/dL — ABNORMAL HIGH (ref 0.44–1.00)

## 2023-12-25 MED ORDER — IOHEXOL 300 MG/ML  SOLN
100.0000 mL | Freq: Once | INTRAMUSCULAR | Status: AC | PRN
  Administered 2023-12-25: 100 mL via INTRAVENOUS

## 2023-12-31 ENCOUNTER — Other Ambulatory Visit: Payer: Self-pay

## 2024-01-01 ENCOUNTER — Encounter: Payer: Self-pay | Admitting: Oncology

## 2024-01-01 ENCOUNTER — Inpatient Hospital Stay: Admitting: Oncology

## 2024-01-01 ENCOUNTER — Inpatient Hospital Stay: Attending: Oncology

## 2024-01-01 ENCOUNTER — Inpatient Hospital Stay

## 2024-01-01 VITALS — BP 132/58 | HR 78 | Temp 98.8°F | Resp 16 | Wt 142.0 lb

## 2024-01-01 DIAGNOSIS — C7951 Secondary malignant neoplasm of bone: Secondary | ICD-10-CM

## 2024-01-01 DIAGNOSIS — C3491 Malignant neoplasm of unspecified part of right bronchus or lung: Secondary | ICD-10-CM

## 2024-01-01 DIAGNOSIS — K7460 Unspecified cirrhosis of liver: Secondary | ICD-10-CM | POA: Diagnosis not present

## 2024-01-01 DIAGNOSIS — Z79899 Other long term (current) drug therapy: Secondary | ICD-10-CM | POA: Diagnosis not present

## 2024-01-01 DIAGNOSIS — K760 Fatty (change of) liver, not elsewhere classified: Secondary | ICD-10-CM | POA: Insufficient documentation

## 2024-01-01 DIAGNOSIS — Z800 Family history of malignant neoplasm of digestive organs: Secondary | ICD-10-CM | POA: Insufficient documentation

## 2024-01-01 DIAGNOSIS — C3411 Malignant neoplasm of upper lobe, right bronchus or lung: Secondary | ICD-10-CM | POA: Insufficient documentation

## 2024-01-01 DIAGNOSIS — I25110 Atherosclerotic heart disease of native coronary artery with unstable angina pectoris: Secondary | ICD-10-CM | POA: Diagnosis not present

## 2024-01-01 DIAGNOSIS — Z7982 Long term (current) use of aspirin: Secondary | ICD-10-CM | POA: Insufficient documentation

## 2024-01-01 DIAGNOSIS — Z87891 Personal history of nicotine dependence: Secondary | ICD-10-CM | POA: Diagnosis not present

## 2024-01-01 DIAGNOSIS — Z90710 Acquired absence of both cervix and uterus: Secondary | ICD-10-CM | POA: Diagnosis not present

## 2024-01-01 DIAGNOSIS — N189 Chronic kidney disease, unspecified: Secondary | ICD-10-CM | POA: Diagnosis not present

## 2024-01-01 DIAGNOSIS — I700 Atherosclerosis of aorta: Secondary | ICD-10-CM | POA: Diagnosis not present

## 2024-01-01 DIAGNOSIS — N1831 Chronic kidney disease, stage 3a: Secondary | ICD-10-CM

## 2024-01-01 DIAGNOSIS — K5730 Diverticulosis of large intestine without perforation or abscess without bleeding: Secondary | ICD-10-CM | POA: Diagnosis not present

## 2024-01-01 DIAGNOSIS — K429 Umbilical hernia without obstruction or gangrene: Secondary | ICD-10-CM | POA: Diagnosis not present

## 2024-01-01 DIAGNOSIS — Z833 Family history of diabetes mellitus: Secondary | ICD-10-CM | POA: Insufficient documentation

## 2024-01-01 DIAGNOSIS — E1122 Type 2 diabetes mellitus with diabetic chronic kidney disease: Secondary | ICD-10-CM | POA: Diagnosis not present

## 2024-01-01 DIAGNOSIS — Z8261 Family history of arthritis: Secondary | ICD-10-CM | POA: Insufficient documentation

## 2024-01-01 DIAGNOSIS — Z5111 Encounter for antineoplastic chemotherapy: Secondary | ICD-10-CM | POA: Diagnosis present

## 2024-01-01 DIAGNOSIS — M549 Dorsalgia, unspecified: Secondary | ICD-10-CM | POA: Insufficient documentation

## 2024-01-01 LAB — CMP (CANCER CENTER ONLY)
ALT: 14 U/L (ref 0–44)
AST: 19 U/L (ref 15–41)
Albumin: 4 g/dL (ref 3.5–5.0)
Alkaline Phosphatase: 39 U/L (ref 38–126)
Anion gap: 8 (ref 5–15)
BUN: 28 mg/dL — ABNORMAL HIGH (ref 8–23)
CO2: 26 mmol/L (ref 22–32)
Calcium: 9.4 mg/dL (ref 8.9–10.3)
Chloride: 102 mmol/L (ref 98–111)
Creatinine: 1.02 mg/dL — ABNORMAL HIGH (ref 0.44–1.00)
GFR, Estimated: 57 mL/min — ABNORMAL LOW (ref >60)
Glucose, Bld: 131 mg/dL — ABNORMAL HIGH (ref 70–99)
Potassium: 4.1 mmol/L (ref 3.5–5.1)
Sodium: 136 mmol/L (ref 135–145)
Total Bilirubin: 0.8 mg/dL (ref 0.0–1.2)
Total Protein: 7 g/dL (ref 6.5–8.1)

## 2024-01-01 LAB — CBC WITH DIFFERENTIAL (CANCER CENTER ONLY)
Abs Immature Granulocytes: 0.01 K/uL (ref 0.00–0.07)
Basophils Absolute: 0 K/uL (ref 0.0–0.1)
Basophils Relative: 1 %
Eosinophils Absolute: 0.2 K/uL (ref 0.0–0.5)
Eosinophils Relative: 3 %
HCT: 36.3 % (ref 36.0–46.0)
Hemoglobin: 12.3 g/dL (ref 12.0–15.0)
Immature Granulocytes: 0 %
Lymphocytes Relative: 29 %
Lymphs Abs: 1.6 K/uL (ref 0.7–4.0)
MCH: 32.8 pg (ref 26.0–34.0)
MCHC: 33.9 g/dL (ref 30.0–36.0)
MCV: 96.8 fL (ref 80.0–100.0)
Monocytes Absolute: 0.5 K/uL (ref 0.1–1.0)
Monocytes Relative: 9 %
Neutro Abs: 3.3 K/uL (ref 1.7–7.7)
Neutrophils Relative %: 58 %
Platelet Count: 174 K/uL (ref 150–400)
RBC: 3.75 MIL/uL — ABNORMAL LOW (ref 3.87–5.11)
RDW: 12.9 % (ref 11.5–15.5)
WBC Count: 5.6 K/uL (ref 4.0–10.5)
nRBC: 0 % (ref 0.0–0.2)

## 2024-01-01 MED ORDER — DENOSUMAB 120 MG/1.7ML ~~LOC~~ SOLN
120.0000 mg | Freq: Once | SUBCUTANEOUS | Status: AC
  Administered 2024-01-01 (×2): 120 mg via SUBCUTANEOUS
  Filled 2024-01-01: qty 1.7

## 2024-01-01 NOTE — Assessment & Plan Note (Signed)
#  Bone metastasis, patient cannot tolerate Zometa .   Labs are reviewed and discussed with patient. Proceed with Xgeva   Obtain bone scan, if remains negative, plan to go on Xgeva  break

## 2024-01-01 NOTE — Assessment & Plan Note (Signed)
 Avoid nephrotoxins. Encourage oral hydration.  Elevated Cr could be due to Osimertinib or related to diabetes, or recent NSAID use.  Continue observation.

## 2024-01-01 NOTE — Assessment & Plan Note (Addendum)
#  Stage IV lung adenocarcinoma. 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  Labs are reviewed and discussed with patient.  Continue Osimertinib  80 mg daily.  CT in July 2025, no metastatic or recurrence disease

## 2024-01-01 NOTE — Progress Notes (Signed)
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 Basal diam:  2.72 cm RV Mid diam:    1.87 cm RV S prime:     12.10 cm/s LEFT ATRIUM             Index        RIGHT ATRIUM          Index LA diam:        2.80 cm 1.66 cm/m   RA Area:     9.41 cm LA Vol (A2C):   52.7 ml 31.23 ml/m  RA Volume:   16.90 ml 10.01 ml/m LA Vol (A4C):   38.4 ml 22.75 ml/m LA Biplane Vol: 48.4 ml 28.68 ml/m  AORTIC VALVE AV Vmax:           119.00 cm/s AV Vmean:          79.800 cm/s AV VTI:            0.291 m AV Peak Grad:      5.7 mmHg AV Mean Grad:      3.0 mmHg LVOT Vmax:         98.50 cm/s LVOT Vmean:        64.600 cm/s LVOT VTI:          0.231 m LVOT/AV VTI ratio: 0.79  AORTA Ao Asc diam: 2.90 cm MITRAL VALVE MV Area (PHT): 4.31 cm    SHUNTS MV Decel Time: 176 msec    Systemic VTI: 0.23 m MV E velocity: 89.20 cm/s MV A velocity: 74.80 cm/s MV E/A ratio:  1.19 Redell Cave MD Electronically signed by Redell Cave MD Signature Date/Time: 10/30/2023/3:37:26 PM    Final

## 2024-01-01 NOTE — Assessment & Plan Note (Signed)
 Treatment plan as listed above.

## 2024-01-02 ENCOUNTER — Other Ambulatory Visit: Payer: Self-pay

## 2024-01-03 ENCOUNTER — Other Ambulatory Visit: Payer: Self-pay

## 2024-01-06 ENCOUNTER — Other Ambulatory Visit: Payer: Self-pay

## 2024-01-08 ENCOUNTER — Other Ambulatory Visit: Payer: Self-pay | Admitting: Pharmacy Technician

## 2024-01-08 ENCOUNTER — Other Ambulatory Visit: Payer: Self-pay

## 2024-01-08 NOTE — Progress Notes (Signed)
 Specialty Pharmacy Refill Coordination Note  Tiffany Mann is a 78 y.o. female contacted today regarding refills of specialty medication(s) Osimertinib  Mesylate (TAGRISSO )   Patient requested Delivery   Delivery date: 01/09/24   Verified address: [address]  [place]   Medication will be filled on 01/08/24.

## 2024-01-17 ENCOUNTER — Other Ambulatory Visit: Payer: Self-pay

## 2024-01-21 ENCOUNTER — Ambulatory Visit
Admission: RE | Admit: 2024-01-21 | Discharge: 2024-01-21 | Disposition: A | Discharge status: Home / Self Care | Source: Ambulatory Visit | Attending: Oncology

## 2024-01-21 ENCOUNTER — Ambulatory Visit
Admission: RE | Admit: 2024-01-21 | Discharge: 2024-01-21 | Disposition: A | Discharge status: Home / Self Care | Source: Ambulatory Visit | Attending: Oncology | Admitting: Oncology

## 2024-01-21 DIAGNOSIS — C3491 Malignant neoplasm of unspecified part of right bronchus or lung: Secondary | ICD-10-CM | POA: Insufficient documentation

## 2024-01-21 MED ORDER — TECHNETIUM TC 99M MEDRONATE IV KIT
20.0000 | PACK | Freq: Once | INTRAVENOUS | Status: AC | PRN
  Administered 2024-01-21: 24.38 via INTRAVENOUS

## 2024-01-30 ENCOUNTER — Other Ambulatory Visit: Payer: Self-pay

## 2024-02-11 ENCOUNTER — Other Ambulatory Visit (HOSPITAL_COMMUNITY): Payer: Self-pay

## 2024-02-11 ENCOUNTER — Other Ambulatory Visit: Payer: Self-pay

## 2024-02-11 NOTE — Progress Notes (Signed)
 Specialty Pharmacy Refill Coordination Note  Spoke with Tiffany Mann is a 78 y.o. female contacted today regarding refills of specialty medication(s) Osimertinib  Mesylate (TAGRISSO )  Doses on hand: 10  Patient requested: Delivery   Delivery date: 02/14/24   Verified address: [address] [place] [address]  Medication will be filled on 02/13/23.

## 2024-02-12 ENCOUNTER — Other Ambulatory Visit: Payer: Self-pay

## 2024-02-13 ENCOUNTER — Other Ambulatory Visit: Payer: Self-pay

## 2024-03-10 ENCOUNTER — Other Ambulatory Visit: Payer: Self-pay

## 2024-03-10 NOTE — Progress Notes (Signed)
 Specialty Pharmacy Refill Coordination Note  Tiffany Mann is a 78 y.o. female contacted today regarding refills of specialty medication(s) Osimertinib  Mesylate (TAGRISSO )   Patient requested Delivery   Delivery date: 03/13/24   Verified address: [address] [place] [address]   Medication will be filled on 03/12/24.

## 2024-03-11 ENCOUNTER — Other Ambulatory Visit: Payer: Self-pay

## 2024-04-02 ENCOUNTER — Other Ambulatory Visit: Payer: Self-pay | Admitting: Oncology

## 2024-04-02 ENCOUNTER — Other Ambulatory Visit: Payer: Self-pay

## 2024-04-02 DIAGNOSIS — C3491 Malignant neoplasm of unspecified part of right bronchus or lung: Secondary | ICD-10-CM

## 2024-04-03 ENCOUNTER — Other Ambulatory Visit: Payer: Self-pay

## 2024-04-03 MED ORDER — OSIMERTINIB MESYLATE 80 MG PO TABS
ORAL_TABLET | Freq: Every day | ORAL | 3 refills | Status: AC
  Filled 2024-04-03 – 2024-05-08 (×2): qty 30, 30d supply, fill #0
  Filled 2024-06-03: qty 30, 30d supply, fill #1
  Filled 2024-06-26: qty 30, 30d supply, fill #2

## 2024-04-07 ENCOUNTER — Inpatient Hospital Stay: Admitting: Oncology

## 2024-04-07 ENCOUNTER — Other Ambulatory Visit: Payer: Self-pay

## 2024-04-07 ENCOUNTER — Encounter: Payer: Self-pay | Admitting: Oncology

## 2024-04-07 ENCOUNTER — Inpatient Hospital Stay: Attending: Oncology

## 2024-04-07 VITALS — BP 143/60 | HR 82 | Temp 97.6°F | Resp 19 | Wt 143.5 lb

## 2024-04-07 DIAGNOSIS — J439 Emphysema, unspecified: Secondary | ICD-10-CM | POA: Diagnosis not present

## 2024-04-07 DIAGNOSIS — Z800 Family history of malignant neoplasm of digestive organs: Secondary | ICD-10-CM | POA: Insufficient documentation

## 2024-04-07 DIAGNOSIS — Z8261 Family history of arthritis: Secondary | ICD-10-CM | POA: Insufficient documentation

## 2024-04-07 DIAGNOSIS — K429 Umbilical hernia without obstruction or gangrene: Secondary | ICD-10-CM | POA: Diagnosis not present

## 2024-04-07 DIAGNOSIS — I700 Atherosclerosis of aorta: Secondary | ICD-10-CM | POA: Diagnosis not present

## 2024-04-07 DIAGNOSIS — K5730 Diverticulosis of large intestine without perforation or abscess without bleeding: Secondary | ICD-10-CM | POA: Diagnosis not present

## 2024-04-07 DIAGNOSIS — N1831 Chronic kidney disease, stage 3a: Secondary | ICD-10-CM

## 2024-04-07 DIAGNOSIS — Z90710 Acquired absence of both cervix and uterus: Secondary | ICD-10-CM | POA: Diagnosis not present

## 2024-04-07 DIAGNOSIS — Z5111 Encounter for antineoplastic chemotherapy: Secondary | ICD-10-CM | POA: Insufficient documentation

## 2024-04-07 DIAGNOSIS — Z7982 Long term (current) use of aspirin: Secondary | ICD-10-CM | POA: Diagnosis not present

## 2024-04-07 DIAGNOSIS — Z9842 Cataract extraction status, left eye: Secondary | ICD-10-CM | POA: Diagnosis not present

## 2024-04-07 DIAGNOSIS — E1122 Type 2 diabetes mellitus with diabetic chronic kidney disease: Secondary | ICD-10-CM | POA: Insufficient documentation

## 2024-04-07 DIAGNOSIS — Z9841 Cataract extraction status, right eye: Secondary | ICD-10-CM | POA: Diagnosis not present

## 2024-04-07 DIAGNOSIS — K7460 Unspecified cirrhosis of liver: Secondary | ICD-10-CM | POA: Insufficient documentation

## 2024-04-07 DIAGNOSIS — C7951 Secondary malignant neoplasm of bone: Secondary | ICD-10-CM | POA: Diagnosis not present

## 2024-04-07 DIAGNOSIS — Z8759 Personal history of other complications of pregnancy, childbirth and the puerperium: Secondary | ICD-10-CM | POA: Diagnosis not present

## 2024-04-07 DIAGNOSIS — N189 Chronic kidney disease, unspecified: Secondary | ICD-10-CM | POA: Insufficient documentation

## 2024-04-07 DIAGNOSIS — C73 Malignant neoplasm of thyroid gland: Secondary | ICD-10-CM

## 2024-04-07 DIAGNOSIS — Z79899 Other long term (current) drug therapy: Secondary | ICD-10-CM | POA: Insufficient documentation

## 2024-04-07 DIAGNOSIS — D497 Neoplasm of unspecified behavior of endocrine glands and other parts of nervous system: Secondary | ICD-10-CM | POA: Insufficient documentation

## 2024-04-07 DIAGNOSIS — K8050 Calculus of bile duct without cholangitis or cholecystitis without obstruction: Secondary | ICD-10-CM | POA: Insufficient documentation

## 2024-04-07 DIAGNOSIS — Z87891 Personal history of nicotine dependence: Secondary | ICD-10-CM | POA: Insufficient documentation

## 2024-04-07 DIAGNOSIS — J90 Pleural effusion, not elsewhere classified: Secondary | ICD-10-CM | POA: Insufficient documentation

## 2024-04-07 DIAGNOSIS — D649 Anemia, unspecified: Secondary | ICD-10-CM

## 2024-04-07 DIAGNOSIS — C3491 Malignant neoplasm of unspecified part of right bronchus or lung: Secondary | ICD-10-CM | POA: Diagnosis not present

## 2024-04-07 DIAGNOSIS — D631 Anemia in chronic kidney disease: Secondary | ICD-10-CM | POA: Diagnosis not present

## 2024-04-07 DIAGNOSIS — Z833 Family history of diabetes mellitus: Secondary | ICD-10-CM | POA: Insufficient documentation

## 2024-04-07 DIAGNOSIS — K760 Fatty (change of) liver, not elsewhere classified: Secondary | ICD-10-CM | POA: Diagnosis not present

## 2024-04-07 DIAGNOSIS — C3411 Malignant neoplasm of upper lobe, right bronchus or lung: Secondary | ICD-10-CM | POA: Insufficient documentation

## 2024-04-07 LAB — CBC WITH DIFFERENTIAL (CANCER CENTER ONLY)
Abs Immature Granulocytes: 0.01 K/uL (ref 0.00–0.07)
Basophils Absolute: 0 K/uL (ref 0.0–0.1)
Basophils Relative: 1 %
Eosinophils Absolute: 0.2 K/uL (ref 0.0–0.5)
Eosinophils Relative: 4 %
HCT: 33.5 % — ABNORMAL LOW (ref 36.0–46.0)
Hemoglobin: 11.3 g/dL — ABNORMAL LOW (ref 12.0–15.0)
Immature Granulocytes: 0 %
Lymphocytes Relative: 34 %
Lymphs Abs: 1.7 K/uL (ref 0.7–4.0)
MCH: 32.1 pg (ref 26.0–34.0)
MCHC: 33.7 g/dL (ref 30.0–36.0)
MCV: 95.2 fL (ref 80.0–100.0)
Monocytes Absolute: 0.4 K/uL (ref 0.1–1.0)
Monocytes Relative: 9 %
Neutro Abs: 2.6 K/uL (ref 1.7–7.7)
Neutrophils Relative %: 52 %
Platelet Count: 166 K/uL (ref 150–400)
RBC: 3.52 MIL/uL — ABNORMAL LOW (ref 3.87–5.11)
RDW: 13.2 % (ref 11.5–15.5)
WBC Count: 5 K/uL (ref 4.0–10.5)
nRBC: 0 % (ref 0.0–0.2)

## 2024-04-07 LAB — CMP (CANCER CENTER ONLY)
ALT: 12 U/L (ref 0–44)
AST: 19 U/L (ref 15–41)
Albumin: 3.9 g/dL (ref 3.5–5.0)
Alkaline Phosphatase: 44 U/L (ref 38–126)
Anion gap: 10 (ref 5–15)
BUN: 19 mg/dL (ref 8–23)
CO2: 25 mmol/L (ref 22–32)
Calcium: 9.4 mg/dL (ref 8.9–10.3)
Chloride: 105 mmol/L (ref 98–111)
Creatinine: 1.2 mg/dL — ABNORMAL HIGH (ref 0.44–1.00)
GFR, Estimated: 47 mL/min — ABNORMAL LOW (ref >60)
Glucose, Bld: 132 mg/dL — ABNORMAL HIGH (ref 70–99)
Potassium: 4.5 mmol/L (ref 3.5–5.1)
Sodium: 140 mmol/L (ref 135–145)
Total Bilirubin: 0.5 mg/dL (ref 0.0–1.2)
Total Protein: 6.5 g/dL (ref 6.5–8.1)

## 2024-04-07 NOTE — Assessment & Plan Note (Signed)
 Treatment plan as listed above.
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
## 2024-04-07 NOTE — Assessment & Plan Note (Signed)
#  thyroid  noninvasive follicular thyroid  neoplasm. Given her condition of stage IV lung cancer, it was felt that no need for additional surgery. Continue follow-up with endocrinology

## 2024-04-07 NOTE — Assessment & Plan Note (Addendum)
#  Stage IV lung adenocarcinoma. 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  Labs are reviewed and discussed with patient.  Continue Osimertinib  80 mg daily.  CT in August 2025, no metastatic or recurrence disease  Repeat CT in December.

## 2024-04-07 NOTE — Assessment & Plan Note (Signed)
#  Bone metastasis, patient cannot tolerate Zometa .   Previously on Xgeva  every 3 months. Bone survey showed no active bone metastasis. She can come off Xgeva  for now.  Continue monitoring.

## 2024-04-07 NOTE — Assessment & Plan Note (Addendum)
 Anemia due to chronic kidney disease Lab Results  Component Value Date   HGB 11.3 (L) 04/07/2024   TIBC 360 06/03/2023   IRONPCTSAT 21 06/03/2023   FERRITIN 146 06/03/2023   Hemoglobin is stable.

## 2024-04-07 NOTE — Assessment & Plan Note (Signed)
 Avoid nephrotoxins. Encourage oral hydration.  Elevated Cr could be due to Osimertinib or related to diabetes, or recent NSAID use.  Continue observation.

## 2024-04-08 ENCOUNTER — Telehealth: Payer: Self-pay | Admitting: Oncology

## 2024-04-08 ENCOUNTER — Encounter: Payer: Self-pay | Admitting: Oncology

## 2024-04-08 NOTE — Telephone Encounter (Signed)
 Called pt to sched CT - confirmed date/time/location w/pt - LH

## 2024-04-09 ENCOUNTER — Other Ambulatory Visit: Payer: Self-pay

## 2024-04-09 ENCOUNTER — Other Ambulatory Visit (HOSPITAL_COMMUNITY): Payer: Self-pay

## 2024-04-22 ENCOUNTER — Other Ambulatory Visit: Payer: Self-pay

## 2024-05-08 ENCOUNTER — Other Ambulatory Visit: Payer: Self-pay

## 2024-05-08 NOTE — Progress Notes (Signed)
 Specialty Pharmacy Refill Coordination Note  Tiffany Mann is a 78 y.o. female contacted today regarding refills of specialty medication(s) Osimertinib  Mesylate (TAGRISSO )   Patient requested Delivery   Delivery date: 05/12/24   Verified address: [address] [place] [address]   Medication will be filled on: 05/11/24

## 2024-05-08 NOTE — Progress Notes (Signed)
 Specialty Pharmacy Ongoing Clinical Assessment Note  Tiffany Mann is a 78 y.o. female who is being followed by the specialty pharmacy service for RxSp Oncology   Patient's specialty medication(s) reviewed today: Osimertinib  Mesylate (TAGRISSO )   Missed doses in the last 4 weeks: 0   Patient/Caregiver did not have any additional questions or concerns.   Therapeutic benefit summary: Patient is achieving benefit   Adverse events/side effects summary: No adverse events/side effects   Patient's therapy is appropriate to: Continue    Goals Addressed             This Visit's Progress    Slow Disease Progression   On track    Patient is on track. Patient will maintain adherence. Per provider note from 04/07/24, CT in August showed no metastatic or recurrent disease.         Follow up: 6 months  [facility]

## 2024-05-13 ENCOUNTER — Encounter: Payer: Self-pay | Admitting: Oncology

## 2024-05-19 ENCOUNTER — Ambulatory Visit
Admission: RE | Admit: 2024-05-19 | Discharge: 2024-05-19 | Disposition: A | Discharge status: Home / Self Care | Source: Ambulatory Visit | Attending: Oncology | Admitting: Oncology

## 2024-05-19 DIAGNOSIS — C3491 Malignant neoplasm of unspecified part of right bronchus or lung: Secondary | ICD-10-CM | POA: Diagnosis present

## 2024-05-19 MED ORDER — IOHEXOL 300 MG/ML  SOLN
100.0000 mL | Freq: Once | INTRAMUSCULAR | Status: AC | PRN
  Administered 2024-05-19: 100 mL via INTRAVENOUS

## 2024-06-03 ENCOUNTER — Other Ambulatory Visit: Payer: Self-pay

## 2024-06-03 ENCOUNTER — Other Ambulatory Visit: Payer: Self-pay | Admitting: Pharmacy Technician

## 2024-06-03 NOTE — Progress Notes (Signed)
 Specialty Pharmacy Refill Coordination Note  Tiffany Mann is a 79 y.o. female contacted today regarding refills of specialty medication(s) Osimertinib  Mesylate (TAGRISSO )   Patient requested Delivery   Delivery date: 06/05/24   Verified address: [address]  [place]   Medication will be filled on: 06/04/24

## 2024-06-04 ENCOUNTER — Other Ambulatory Visit: Payer: Self-pay

## 2024-06-26 ENCOUNTER — Other Ambulatory Visit: Payer: Self-pay

## 2024-07-08 ENCOUNTER — Inpatient Hospital Stay

## 2024-07-08 ENCOUNTER — Inpatient Hospital Stay: Admitting: Oncology
